# Patient Record
Sex: Female | Born: 1935 | Race: White | Hispanic: No | Smoking: Never smoker
Health system: Southern US, Community
[De-identification: ages and names within clinical notes are randomized; demographics above are authoritative.]

## PROBLEM LIST (undated history)

## (undated) DIAGNOSIS — E46 Unspecified protein-calorie malnutrition: Secondary | ICD-10-CM

## (undated) DIAGNOSIS — I10 Essential (primary) hypertension: Secondary | ICD-10-CM

## (undated) DIAGNOSIS — M1990 Unspecified osteoarthritis, unspecified site: Secondary | ICD-10-CM

## (undated) DIAGNOSIS — K2950 Unspecified chronic gastritis without bleeding: Secondary | ICD-10-CM

## (undated) DIAGNOSIS — D649 Anemia, unspecified: Secondary | ICD-10-CM

## (undated) DIAGNOSIS — F419 Anxiety disorder, unspecified: Secondary | ICD-10-CM

## (undated) DIAGNOSIS — N189 Chronic kidney disease, unspecified: Secondary | ICD-10-CM

## (undated) DIAGNOSIS — G2581 Restless legs syndrome: Secondary | ICD-10-CM

## (undated) HISTORY — DX: Anemia, unspecified: D64.9

## (undated) HISTORY — DX: Anxiety disorder, unspecified: F41.9

## (undated) HISTORY — PX: ABDOMINAL HYSTERECTOMY: SHX81

## (undated) HISTORY — DX: Unspecified osteoarthritis, unspecified site: M19.90

## (undated) HISTORY — DX: Unspecified protein-calorie malnutrition: E46

## (undated) HISTORY — DX: Chronic kidney disease, unspecified: N18.9

## (undated) HISTORY — DX: Unspecified chronic gastritis without bleeding: K29.50

## (undated) HISTORY — DX: Restless legs syndrome: G25.81

## (undated) HISTORY — DX: Essential (primary) hypertension: I10

---

## 2004-09-27 ENCOUNTER — Emergency Department: Payer: Self-pay | Admitting: Emergency Medicine

## 2004-09-27 ENCOUNTER — Other Ambulatory Visit: Payer: Self-pay

## 2004-11-19 ENCOUNTER — Ambulatory Visit: Payer: Self-pay | Admitting: Urology

## 2006-10-19 ENCOUNTER — Emergency Department: Payer: Self-pay | Admitting: Emergency Medicine

## 2007-01-14 ENCOUNTER — Other Ambulatory Visit: Payer: Self-pay

## 2007-01-14 ENCOUNTER — Emergency Department: Payer: Self-pay | Admitting: Emergency Medicine

## 2007-12-06 ENCOUNTER — Ambulatory Visit: Payer: Self-pay | Admitting: Internal Medicine

## 2007-12-13 ENCOUNTER — Ambulatory Visit: Payer: Self-pay | Admitting: Internal Medicine

## 2008-06-11 ENCOUNTER — Inpatient Hospital Stay: Payer: Self-pay | Admitting: Internal Medicine

## 2008-08-09 ENCOUNTER — Ambulatory Visit: Payer: Self-pay | Admitting: Internal Medicine

## 2008-12-15 ENCOUNTER — Ambulatory Visit: Payer: Self-pay | Admitting: Internal Medicine

## 2009-01-04 ENCOUNTER — Ambulatory Visit: Payer: Self-pay | Admitting: Internal Medicine

## 2009-12-10 ENCOUNTER — Emergency Department: Payer: Self-pay | Admitting: Internal Medicine

## 2010-01-02 ENCOUNTER — Ambulatory Visit: Payer: Self-pay

## 2010-01-21 ENCOUNTER — Inpatient Hospital Stay: Payer: Self-pay | Admitting: Internal Medicine

## 2010-02-24 ENCOUNTER — Inpatient Hospital Stay: Payer: Self-pay | Admitting: Internal Medicine

## 2010-03-05 LAB — PATHOLOGY REPORT

## 2010-10-21 ENCOUNTER — Ambulatory Visit: Payer: Self-pay | Admitting: Gastroenterology

## 2010-10-23 LAB — PATHOLOGY REPORT

## 2010-11-17 ENCOUNTER — Emergency Department: Payer: Self-pay | Admitting: Emergency Medicine

## 2011-04-22 ENCOUNTER — Other Ambulatory Visit: Payer: Self-pay | Admitting: *Deleted

## 2011-04-22 MED ORDER — SERTRALINE HCL 50 MG PO TABS: 50.0000 mg | ORAL_TABLET | Freq: Every day | ORAL | Status: DC

## 2011-04-22 NOTE — Telephone Encounter (Signed)
Received faxed refill request from pharmacy. Is okay to refill Sertraline?

## 2011-05-12 ENCOUNTER — Ambulatory Visit: Payer: Self-pay | Admitting: Oncology

## 2011-05-16 ENCOUNTER — Other Ambulatory Visit: Payer: Self-pay | Admitting: Internal Medicine

## 2011-05-16 MED ORDER — CLORAZEPATE DIPOTASSIUM 7.5 MG PO TABS: 7.5000 mg | ORAL_TABLET | Freq: Every day | ORAL | Status: AC | PRN

## 2011-05-16 MED ORDER — MELOXICAM 15 MG PO TABS: 15.0000 mg | ORAL_TABLET | Freq: Every day | ORAL | Status: DC

## 2011-06-06 ENCOUNTER — Inpatient Hospital Stay: Payer: Self-pay | Admitting: Internal Medicine

## 2011-06-09 DIAGNOSIS — R740 Nonspecific elevation of levels of transaminase and lactic acid dehydrogenase [LDH]: Secondary | ICD-10-CM

## 2011-06-09 DIAGNOSIS — M6282 Rhabdomyolysis: Secondary | ICD-10-CM

## 2011-06-09 DIAGNOSIS — N179 Acute kidney failure, unspecified: Secondary | ICD-10-CM

## 2011-06-09 DIAGNOSIS — D638 Anemia in other chronic diseases classified elsewhere: Secondary | ICD-10-CM

## 2011-06-11 DIAGNOSIS — N179 Acute kidney failure, unspecified: Secondary | ICD-10-CM

## 2011-06-11 DIAGNOSIS — D631 Anemia in chronic kidney disease: Secondary | ICD-10-CM

## 2011-06-11 DIAGNOSIS — N039 Chronic nephritic syndrome with unspecified morphologic changes: Secondary | ICD-10-CM

## 2011-06-11 DIAGNOSIS — J9600 Acute respiratory failure, unspecified whether with hypoxia or hypercapnia: Secondary | ICD-10-CM

## 2011-06-11 DIAGNOSIS — M6282 Rhabdomyolysis: Secondary | ICD-10-CM

## 2011-06-12 ENCOUNTER — Ambulatory Visit: Payer: Self-pay | Admitting: Oncology

## 2011-06-12 DIAGNOSIS — I517 Cardiomegaly: Secondary | ICD-10-CM

## 2011-06-16 ENCOUNTER — Telehealth: Payer: Self-pay | Admitting: Internal Medicine

## 2011-06-16 NOTE — Telephone Encounter (Signed)
Autumn Wright is being discharge today the hospital is needing you to do an addendum for her discharge even if there are no changes.

## 2011-06-17 ENCOUNTER — Encounter: Payer: Self-pay | Admitting: Internal Medicine

## 2011-06-23 ENCOUNTER — Telehealth: Payer: Self-pay | Admitting: Internal Medicine

## 2011-06-23 NOTE — Telephone Encounter (Signed)
Son called in and states that she was recently seen in the hospital she states she was suppose to get a pain medication called in mscontin 15 mg taken every 12 hours, percocet 10-325 take a tablet every 6 hours as needed.and he states that his mother does have a prescription for hydrocodone this was prescribed by Dr. Angela Cox, it is for pain as well.  His question was does she need to take the two prescriptions she was giving in the hospital instead of the hydrocodone. Please advise.

## 2011-06-24 NOTE — Telephone Encounter (Signed)
The pain medications given to her in the hospital can be continued if her pain is still severe enough to require them.  If the she is using vicodin now that she is home and it is controlling her pain , then she should use the vicodin instead.  Has she made a hospital followup appt?  Both of the other prescriptiosn mentioned cannot be called in.

## 2011-06-24 NOTE — Telephone Encounter (Signed)
Spoke w/Son, she has been taking hydrocodone 7.5/325 every 4 to 6 hours as needed which has been controlling her pain, Med list updated. Son says she has plenty in her bottle, no RF needed at this time. Transferred son to schedule hosptial f/u.

## 2011-06-30 ENCOUNTER — Telehealth: Payer: Self-pay | Admitting: Internal Medicine

## 2011-06-30 DIAGNOSIS — M549 Dorsalgia, unspecified: Secondary | ICD-10-CM

## 2011-06-30 MED ORDER — OXYCODONE-ACETAMINOPHEN 5-325 MG PO TABS: 1.0000 | ORAL_TABLET | Freq: Four times a day (QID) | ORAL | Status: DC | PRN

## 2011-06-30 NOTE — Telephone Encounter (Signed)
Larita Fife from [facility] called and stated patient reported to her today that the hydrocodone that was prescribed for pain is making her have nightmares at night so she has stopped taking the medication, and tylenol does not help.  She wanted to know if there is something else she can take for pain.  Please advise.

## 2011-06-30 NOTE — Telephone Encounter (Signed)
Notified Larita Fife of the Rx, she stated either the patient or patients son will pick up Rx.

## 2011-06-30 NOTE — Telephone Encounter (Signed)
She was takign percocet previously which is stronger .  I have printed an rx for percocet that she may use max #/day.

## 2011-07-01 ENCOUNTER — Encounter: Payer: Self-pay | Admitting: Internal Medicine

## 2011-07-07 ENCOUNTER — Telehealth: Payer: Self-pay | Admitting: Internal Medicine

## 2011-07-07 MED ORDER — ZOLPIDEM TARTRATE 10 MG PO TABS: 10.0000 mg | ORAL_TABLET | Freq: Every evening | ORAL | Status: DC | PRN

## 2011-07-07 NOTE — Telephone Encounter (Signed)
Yes she should keep the appt with  Dr Thedore Mins, and yes we can resume zolpidem.  Does she need an rx?   Ok to call in if no,  10 mg   #30 2 refills

## 2011-07-07 NOTE — Telephone Encounter (Signed)
Patient called and wanted to know if she needed to keep the appt with Dr. Thedore Mins on Nov 30th because she has an appt with you on Dec 4th and Dr. Nicholaus Corolla on the 6th.  Patient also wanted to know if she could go back on zolpidem because she is having trouble sleeping.  Please advise.

## 2011-07-07 NOTE — Telephone Encounter (Signed)
Patient does want her prescription for zolpidem 10 mg #30 with 2 refills.

## 2011-07-07 NOTE — Telephone Encounter (Signed)
Rx has been called in  

## 2011-07-08 ENCOUNTER — Other Ambulatory Visit: Payer: Self-pay | Admitting: Internal Medicine

## 2011-07-08 MED ORDER — LISINOPRIL 20 MG PO TABS: 20.0000 mg | ORAL_TABLET | Freq: Every day | ORAL | Status: DC

## 2011-07-12 ENCOUNTER — Ambulatory Visit: Payer: Self-pay | Admitting: Oncology

## 2011-07-12 ENCOUNTER — Encounter: Payer: Self-pay | Admitting: Internal Medicine

## 2011-07-15 ENCOUNTER — Encounter: Payer: Self-pay | Admitting: Internal Medicine

## 2011-07-15 ENCOUNTER — Ambulatory Visit (INDEPENDENT_AMBULATORY_CARE_PROVIDER_SITE_OTHER): Payer: Medicare Other | Admitting: Internal Medicine

## 2011-07-15 DIAGNOSIS — R269 Unspecified abnormalities of gait and mobility: Secondary | ICD-10-CM

## 2011-07-15 DIAGNOSIS — K5289 Other specified noninfective gastroenteritis and colitis: Secondary | ICD-10-CM

## 2011-07-15 DIAGNOSIS — E119 Type 2 diabetes mellitus without complications: Secondary | ICD-10-CM

## 2011-07-15 DIAGNOSIS — K2940 Chronic atrophic gastritis without bleeding: Secondary | ICD-10-CM

## 2011-07-15 DIAGNOSIS — I10 Essential (primary) hypertension: Secondary | ICD-10-CM

## 2011-07-15 DIAGNOSIS — N189 Chronic kidney disease, unspecified: Secondary | ICD-10-CM

## 2011-07-15 DIAGNOSIS — J811 Chronic pulmonary edema: Secondary | ICD-10-CM

## 2011-07-15 DIAGNOSIS — G2581 Restless legs syndrome: Secondary | ICD-10-CM

## 2011-07-15 DIAGNOSIS — E46 Unspecified protein-calorie malnutrition: Secondary | ICD-10-CM | POA: Insufficient documentation

## 2011-07-15 DIAGNOSIS — K2950 Unspecified chronic gastritis without bleeding: Secondary | ICD-10-CM

## 2011-07-15 DIAGNOSIS — F329 Major depressive disorder, single episode, unspecified: Secondary | ICD-10-CM

## 2011-07-15 DIAGNOSIS — D500 Iron deficiency anemia secondary to blood loss (chronic): Secondary | ICD-10-CM

## 2011-07-15 NOTE — Patient Instructions (Signed)
You do not appear to need supplemental oxygen anymore.    Please turn off the oxygen and check your pulse ox without it after 1  Hours.  As long as your oxygen is above 88% ,  Yo do not need to resume the oxygen and we can return it to the company  Pleas ask Dr. Orlie Dakin and Dr. Thedore Mins to forward any labs they draw to me.

## 2011-07-15 NOTE — Progress Notes (Signed)
  Subjective:    Patient ID: Autumn Wright, female    DOB: 27-Sep-1934, 76 y.o.   MRN: [id number]  HPI Autumn Wright is a 76 yo [place] female who present for hospital followu.  seh was admitted on Oct with 26 with Acute renal failure secondary ro rhabdomyolysis , Cr was 7.0 ,  accompanied by 2 day history of N/V resulting in dehydration.  9 Day stay, received IV bicarbonate with eventual resolution of rhabdo and ARF.  Because of concurrent malnutrition and low albumin stores she had considerable weight gain and pulmonary edema with significant hypoxia.  Also received 1 unit PRBCs transfusion for hgb < 8 and hypoxia. Was dc'd home with 02, home health and home PT.  She is continuing to wear her  02  But Pt has recently measured her ambulatory sats on room air with results > 92%.  Appetite is better, she has gained weight.  Sleep disrupted by dysethesias in feet and she wants to resume Requip.     Review of Systems  Constitutional: Negative for fever, chills and unexpected weight change.  HENT: Negative for hearing loss, ear pain, nosebleeds, congestion, sore throat, facial swelling, rhinorrhea, sneezing, mouth sores, trouble swallowing, neck pain, neck stiffness, voice change, postnasal drip, sinus pressure, tinnitus and ear discharge.   Eyes: Negative for pain, discharge, redness and visual disturbance.  Respiratory: Negative for cough, chest tightness, shortness of breath, wheezing and stridor.   Cardiovascular: Negative for chest pain, palpitations and leg swelling.  Musculoskeletal: Negative for myalgias and arthralgias.  Skin: Negative for color change and rash.  Neurological: Negative for dizziness, weakness, light-headedness and headaches.  Hematological: Negative for adenopathy.       Objective:   Physical Exam  Constitutional: She is oriented to person, place, and time. She appears well-developed and well-nourished.  HENT:  Mouth/Throat: Oropharynx is clear and moist.  Eyes: EOM are  normal. Pupils are equal, round, and reactive to light. No scleral icterus.  Neck: Normal range of motion. Neck supple. No JVD present. No thyromegaly present.  Cardiovascular: Normal rate, regular rhythm, normal heart sounds and intact distal pulses.   Pulmonary/Chest: Effort normal and breath sounds normal.  Abdominal: Soft. Bowel sounds are normal. She exhibits no mass. There is no tenderness.  Musculoskeletal: Normal range of motion. She exhibits no edema.  Lymphadenopathy:    She has no cervical adenopathy.  Neurological: She is alert and oriented to person, place, and time.  Skin: Skin is warm and dry.  Psychiatric: She has a normal mood and affect.          Assessment & Plan:

## 2011-07-16 ENCOUNTER — Encounter: Payer: Self-pay | Admitting: Internal Medicine

## 2011-07-16 DIAGNOSIS — K2950 Unspecified chronic gastritis without bleeding: Secondary | ICD-10-CM | POA: Insufficient documentation

## 2011-07-16 DIAGNOSIS — D649 Anemia, unspecified: Secondary | ICD-10-CM | POA: Insufficient documentation

## 2011-07-16 DIAGNOSIS — G2581 Restless legs syndrome: Secondary | ICD-10-CM | POA: Insufficient documentation

## 2011-07-16 DIAGNOSIS — N189 Chronic kidney disease, unspecified: Secondary | ICD-10-CM | POA: Insufficient documentation

## 2011-07-16 NOTE — Assessment & Plan Note (Signed)
With recurrent admission for N/V, last on Oct 26th.  Currently asymptomatic and  appetite improving.

## 2011-07-16 NOTE — Assessment & Plan Note (Signed)
With acute failure secondary to rhabdomyolysis.  Has followup with Nephrology next week.

## 2011-07-16 NOTE — Assessment & Plan Note (Signed)
Secondary to chronic gastritis. Improving  weight,  GB was evaluated for chronic cholcystitsi with HIDA sca n , which was normal/

## 2011-07-16 NOTE — Assessment & Plan Note (Signed)
Resuming Requip for persistent symptoms. Interfering with sleep

## 2011-07-16 NOTE — Assessment & Plan Note (Signed)
Resolved by exam.  02 sats today on 2L are exceleltn and 02 sats on room air ambulation of 350 ft were 94% .  Will have her dc it at home,  Recheck with home pulse ox and if > 88% will dc supplemental 02

## 2011-07-16 NOTE — Assessment & Plan Note (Signed)
With negative serologic workup in house,  Requiring transfusion due to concurrent hypoxia,  Followup with Dr. Orlie Dakin for Procrit injections next week.

## 2011-07-17 ENCOUNTER — Ambulatory Visit: Payer: Self-pay | Admitting: Oncology

## 2011-07-21 ENCOUNTER — Telehealth: Payer: Self-pay | Admitting: Internal Medicine

## 2011-07-21 NOTE — Telephone Encounter (Signed)
I cannot prescribe something else without seeing her.  There are too many potential side effects and I need more information

## 2011-07-21 NOTE — Telephone Encounter (Signed)
Patient having problems sleeping . Her Zolpidem is not working.

## 2011-07-21 NOTE — Telephone Encounter (Signed)
Patient is asking if she can increase her zolpidem or either try something different because it is not helping her sleep.

## 2011-07-22 NOTE — Telephone Encounter (Signed)
Patient returned call and was notified. Appt scheduled.

## 2011-07-22 NOTE — Telephone Encounter (Signed)
Left message asking patient to return my call.

## 2011-07-25 ENCOUNTER — Ambulatory Visit (INDEPENDENT_AMBULATORY_CARE_PROVIDER_SITE_OTHER): Payer: Medicare Other | Admitting: Internal Medicine

## 2011-07-25 ENCOUNTER — Encounter: Payer: Self-pay | Admitting: Internal Medicine

## 2011-07-25 VITALS — BP 122/68 | HR 59 | Temp 98.5°F | Ht 62.0 in | Wt 168.0 lb

## 2011-07-25 DIAGNOSIS — J811 Chronic pulmonary edema: Secondary | ICD-10-CM

## 2011-07-25 DIAGNOSIS — K2970 Gastritis, unspecified, without bleeding: Secondary | ICD-10-CM

## 2011-07-25 DIAGNOSIS — F5104 Psychophysiologic insomnia: Secondary | ICD-10-CM

## 2011-07-25 DIAGNOSIS — G2581 Restless legs syndrome: Secondary | ICD-10-CM

## 2011-07-25 DIAGNOSIS — N189 Chronic kidney disease, unspecified: Secondary | ICD-10-CM

## 2011-07-25 DIAGNOSIS — D500 Iron deficiency anemia secondary to blood loss (chronic): Secondary | ICD-10-CM

## 2011-07-25 DIAGNOSIS — G4700 Insomnia, unspecified: Secondary | ICD-10-CM

## 2011-07-25 MED ORDER — OMEPRAZOLE 40 MG PO CPDR: 40.0000 mg | DELAYED_RELEASE_CAPSULE | Freq: Two times a day (BID) | ORAL | Status: DC

## 2011-07-25 MED ORDER — TEMAZEPAM 15 MG PO CAPS: 15.0000 mg | ORAL_CAPSULE | Freq: Every evening | ORAL | Status: DC | PRN

## 2011-07-25 MED ORDER — DOXEPIN HCL 3 MG PO TABS: 1.0000 | ORAL_TABLET | Freq: Every day | ORAL | Status: DC

## 2011-07-25 MED ORDER — SUCRALFATE 1 G PO TABS: 1.0000 g | ORAL_TABLET | Freq: Four times a day (QID) | ORAL | Status: AC

## 2011-07-25 NOTE — Patient Instructions (Addendum)
For your blood pressure.  stop  the hydralazine ( 4 times daily drug)  If your blood pressure begins to stay above 130/80. Increase your metoprolol tartrate to twice daily   For your insomnia:  I have given you 4 days of Silenor to use   i have sent an rx for temazepam to your pharmacy,  O e capsule 1 hour before bedtime.

## 2011-07-26 ENCOUNTER — Encounter: Payer: Self-pay | Admitting: Internal Medicine

## 2011-07-26 DIAGNOSIS — F5104 Psychophysiologic insomnia: Secondary | ICD-10-CM | POA: Insufficient documentation

## 2011-07-26 NOTE — Progress Notes (Signed)
Subjective:    Patient ID: Autumn Wright, female    DOB: 1934-10-12, 76 y.o.   MRN: [id number]  HPI  Autumn Wright is a delightful 76 yr old [place] immigrant who has a history of chronic insomnia, severe anemia, gastritis with episodes of recurrent nausea and vomiting, and chronic back pain due to DDD who presents to dsicuss alternative therapy for her insomnia.  Since discharge from hospital several weeks ago for acute renal failure, anemia, and rhabdomyolysis complicated by hypoxia and severe malnutrtition she has had worsening insomnia despite using zolpidem 10 mg  which she has used for years.  She has early wakening at 2 or 3 am and cannot return to sleep.  Her pain is well controlled.  She does not drink alcohol. She denies nocturia greater than 1 void per night.   She does watch TV in bed, which helps her fall asleep. She hascome to terms with her grief and guilt following her husband Autumn Wright's recent death, but is having frequent nightmares him Autumn Wright was placed in nursing home prior to his death due to worsening dementia with frequent falls and behavioral disturbances).    Past Medical History  Diagnosis Date  . Restless leg syndrome   . Arthritis   . Osteoporosis   . Migraine since age 89  . Hypertension   . Anxiety   . Gastritis, chronic   . Malnutrition   . Anemia   . Chronic kidney disease    Current Outpatient Prescriptions on File Prior to Visit  Medication Sig Dispense Refill  . hydrALAZINE (APRESOLINE) 50 MG tablet Take 50 mg by mouth 4 (four) times daily.        Marland Kitchen loratadine (CLARITIN) 10 MG tablet Take 10 mg by mouth daily.        . metoprolol (LOPRESSOR) 50 MG tablet Take 50 mg by mouth 2 (two) times daily.        Marland Kitchen morphine (MS CONTIN) 15 MG 12 hr tablet Take 15 mg by mouth 2 (two) times daily.        Marland Kitchen nystatin (MYCOSTATIN) powder Apply 100,000 g topically 2 (two) times daily.        Marland Kitchen oxyCODONE-acetaminophen (ROXICET) 5-325 MG per tablet Take 1 tablet by mouth every 6  (six) hours as needed for pain.  90 tablet  0  . polyethylene glycol (MIRALAX / GLYCOLAX) packet Take 17 g by mouth daily.          Review of Systems  Constitutional: Negative for fever, chills and unexpected weight change.  HENT: Negative for hearing loss, ear pain, nosebleeds, congestion, sore throat, facial swelling, rhinorrhea, sneezing, mouth sores, trouble swallowing, neck pain, neck stiffness, voice change, postnasal drip, sinus pressure, tinnitus and ear discharge.   Eyes: Negative for pain, discharge, redness and visual disturbance.  Respiratory: Negative for cough, chest tightness, shortness of breath, wheezing and stridor.   Cardiovascular: Negative for chest pain, palpitations and leg swelling.  Musculoskeletal: Positive for back pain. Negative for myalgias and arthralgias.  Skin: Negative for color change and rash.  Neurological: Negative for dizziness, weakness, light-headedness and headaches.  Hematological: Negative for adenopathy.  Psychiatric/Behavioral: Positive for sleep disturbance. The patient is nervous/anxious.        Objective:   Physical Exam  Constitutional: She is oriented to person, place, and time. She appears well-developed and well-nourished.  HENT:  Mouth/Throat: Oropharynx is clear and moist.  Eyes: EOM are normal. Pupils are equal, round, and reactive to light. No scleral icterus.  Neck: Normal range of motion. Neck supple. No JVD present. No thyromegaly present.  Cardiovascular: Normal rate, regular rhythm, normal heart sounds and intact distal pulses.   Pulmonary/Chest: Effort normal and breath sounds normal.  Musculoskeletal: Normal range of motion. She exhibits no edema.  Lymphadenopathy:    She has no cervical adenopathy.  Neurological: She is alert and oriented to person, place, and time.  Skin: Skin is warm and dry.  Psychiatric: She has a normal mood and affect. Her behavior is normal. Judgment and thought content normal.            Assessment & Plan:   Restless legs syndrome We have resumed Requip at last visit for RLS, and she currently does not report these symptoms as contributors to her current problems with insomnia.   Pulmonary edema Now resolved completely with daily duiretic therapy.  Room air saturations are normal.   Insomnia, psychophysiological Although she suffers from chronic insomnia, her current issues may be secondary to  unresolved guilt related to placing her husband in a nursing home due to her difficulty caring for him at home.  Discussed trial of temazepam .   Chronic kidney disease With recent acute renal failure secondary to rhabdomylosis requiring hospitalization and bicarbonate infusion which resulted in severe pulmonary edema and hypoxia.  Her Cr peaked at 7.4 and continues to improve, now around 2.0.  She has had recent follow up with Dr. Thedore Mins.   Anemia due to blood loss, chronic Multifactorial , with MDS suspected, now managed by  Dr. Orlie Dakin with Procrit.  No plans or BM biopsy unless her anemia worsens despite Procrit injections    Updated Medication List Outpatient Encounter Prescriptions as of 07/25/2011  Medication Sig Dispense Refill  . hydrALAZINE (APRESOLINE) 50 MG tablet Take 50 mg by mouth 4 (four) times daily.        Marland Kitchen loratadine (CLARITIN) 10 MG tablet Take 10 mg by mouth daily.        . metoprolol (LOPRESSOR) 50 MG tablet Take 50 mg by mouth 2 (two) times daily.        Marland Kitchen morphine (MS CONTIN) 15 MG 12 hr tablet Take 15 mg by mouth 2 (two) times daily.        Marland Kitchen nystatin (MYCOSTATIN) powder Apply 100,000 g topically 2 (two) times daily.        Marland Kitchen omeprazole (PRILOSEC) 40 MG capsule Take 1 capsule (40 mg total) by mouth 2 (two) times daily.  60 capsule  11  . oxyCODONE-acetaminophen (ROXICET) 5-325 MG per tablet Take 1 tablet by mouth every 6 (six) hours as needed for pain.  90 tablet  0  . polyethylene glycol (MIRALAX / GLYCOLAX) packet Take 17 g by mouth daily.        Marland Kitchen  DISCONTD: pantoprazole (PROTONIX) 40 MG tablet Take 40 mg by mouth 2 (two) times daily.       Marland Kitchen DISCONTD: sucralfate (CARAFATE) 1 GM/10ML suspension Take 1 g by mouth 4 (four) times daily.       . Doxepin HCl (SILENOR) 3 MG TABS Take 1 tablet (3 mg total) by mouth at bedtime.  4 tablet  0  . sucralfate (CARAFATE) 1 G tablet Take 1 tablet (1 g total) by mouth 4 (four) times daily.  120 tablet  1  . temazepam (RESTORIL) 15 MG capsule Take 1 capsule (15 mg total) by mouth at bedtime as needed for sleep.  30 capsule  0  . DISCONTD: temazepam (RESTORIL) 15 MG capsule  Take 1 capsule (15 mg total) by mouth at bedtime as needed for sleep.  30 capsule  0  . DISCONTD: zolpidem (AMBIEN) 10 MG tablet Take 1 tablet (10 mg total) by mouth at bedtime as needed for sleep.  30 tablet  2

## 2011-07-26 NOTE — Assessment & Plan Note (Signed)
Although she suffers from chronic insomnia, her current issues may be secondary to  unresolved guilt related to placing her husband in a nursing home due to her difficulty caring for him at home.  Discussed trial of temazepam .

## 2011-07-26 NOTE — Assessment & Plan Note (Signed)
We have resumed Requip at last visit for RLS, and she currently does not report these symptoms as contributors to her current problems with insomnia.

## 2011-07-26 NOTE — Assessment & Plan Note (Signed)
Now resolved completely with daily duiretic therapy.  Room air saturations are normal.

## 2011-07-26 NOTE — Assessment & Plan Note (Addendum)
With recent acute renal failure secondary to rhabdomylosis requiring hospitalization and bicarbonate infusion which resulted in severe pulmonary edema and hypoxia.  Her Cr peaked at 7.4 and continues to improve, now around 2.0.  She has had recent follow up with Dr. Thedore Mins.

## 2011-07-26 NOTE — Assessment & Plan Note (Signed)
Multifactorial , with MDS suspected, now managed by  Dr. Orlie Dakin with Procrit.  No plans or BM biopsy unless her anemia worsens despite Procrit injections

## 2011-07-31 ENCOUNTER — Telehealth: Payer: Self-pay | Admitting: *Deleted

## 2011-07-31 NOTE — Telephone Encounter (Signed)
error 

## 2011-08-04 ENCOUNTER — Telehealth: Payer: Self-pay | Admitting: Internal Medicine

## 2011-08-04 MED ORDER — METOPROLOL TARTRATE 50 MG PO TABS: 50.0000 mg | ORAL_TABLET | Freq: Two times a day (BID) | ORAL | Status: DC

## 2011-08-04 NOTE — Telephone Encounter (Signed)
Done. Patient informed

## 2011-08-04 NOTE — Telephone Encounter (Signed)
[phone] Pt called only has 1 bp med left Metoprolol tarta 50mg    She takes 1 in am and 1 in pm [facility] Pt let pt know when this is called in

## 2011-08-12 ENCOUNTER — Ambulatory Visit: Payer: Self-pay | Admitting: Oncology

## 2011-08-12 ENCOUNTER — Encounter: Payer: Self-pay | Admitting: Internal Medicine

## 2011-08-13 ENCOUNTER — Telehealth: Payer: Self-pay | Admitting: Internal Medicine

## 2011-08-13 NOTE — Telephone Encounter (Signed)
With messages like these it is important to refer to the actual medication that she is taking.  Please clarify that she is referring  to temazepam 15 mg that is not working.  Her request or inquiry about The extended release ambien:  It  is not generic and may cost her a lot of money,.  Has she tried increasing the temazepam to 30 mg?

## 2011-08-13 NOTE — Telephone Encounter (Signed)
Spoke with pt and advised her about [place].  She is going to try increasing the temazepam dose to 30 mg's and will call back in a few days with report.

## 2011-08-13 NOTE — Telephone Encounter (Signed)
[phone] Pt called and the rx you gave her to help her sleep is not working very well.  She is still only getting about 4 hours a sleep a night. Is there anything she can take pt asking about a time release ambiem [facility]

## 2011-08-15 ENCOUNTER — Other Ambulatory Visit: Payer: Self-pay | Admitting: *Deleted

## 2011-08-15 DIAGNOSIS — M549 Dorsalgia, unspecified: Secondary | ICD-10-CM

## 2011-08-15 MED ORDER — OXYCODONE-ACETAMINOPHEN 5-325 MG PO TABS: 1.0000 | ORAL_TABLET | Freq: Four times a day (QID) | ORAL | Status: DC | PRN

## 2011-08-15 NOTE — Telephone Encounter (Signed)
Pt is asking for a refill, please call when ready.

## 2011-08-15 NOTE — Telephone Encounter (Signed)
On printer

## 2011-08-15 NOTE — Telephone Encounter (Signed)
Rx printed and signed by Dr. Darrick Huntsman. Patient notified. Rx left up front for pick up.

## 2011-08-15 NOTE — Telephone Encounter (Signed)
Pt was told to take 30 mg's of temazepam to help her sleep, but she realized that she had been taking the 30 mg's instead of 15.  She says the 30 mg's isn't helping and she would like to try ambien cr.  [facility]ses [facility].

## 2011-08-18 MED ORDER — ZOLPIDEM TARTRATE ER 12.5 MG PO TBCR: 12.5000 mg | EXTENDED_RELEASE_TABLET | Freq: Every evening | ORAL | Status: DC | PRN

## 2011-08-18 NOTE — Telephone Encounter (Signed)
Thank you.  ambien cr rx on printer , can call in

## 2011-08-18 NOTE — Telephone Encounter (Signed)
Ambien called to pharmacy, advised pt's son.

## 2011-08-25 ENCOUNTER — Telehealth: Payer: Self-pay | Admitting: Internal Medicine

## 2011-08-25 NOTE — Telephone Encounter (Signed)
Patient thinks she is having a reaction to her Metoprolol 50 mg . She is itching,sweating and has a sore throat.

## 2011-08-25 NOTE — Telephone Encounter (Signed)
Not somehting i can decide over the phone,.  Make appt

## 2011-08-25 NOTE — Telephone Encounter (Signed)
Pt states that about 4 days ago she developed a very sore throat and stuffy nose. Then she started having sweating spells and her face started itching.  She did some research and found that these symptoms could be side effects of metoprolol.  She didn't take that last night or this morning and says the itching in her face is better, but her face is burning- like a wind burn.  She is asking if she should continue to take this or change to something else.  [facility].

## 2011-08-25 NOTE — Telephone Encounter (Signed)
Appt made

## 2011-08-28 ENCOUNTER — Encounter: Payer: Self-pay | Admitting: Internal Medicine

## 2011-08-28 ENCOUNTER — Ambulatory Visit (INDEPENDENT_AMBULATORY_CARE_PROVIDER_SITE_OTHER): Payer: Medicare Other | Admitting: Internal Medicine

## 2011-08-28 DIAGNOSIS — I10 Essential (primary) hypertension: Secondary | ICD-10-CM

## 2011-08-28 DIAGNOSIS — J019 Acute sinusitis, unspecified: Secondary | ICD-10-CM

## 2011-08-28 MED ORDER — AMLODIPINE BESYLATE 10 MG PO TABS: 10.0000 mg | ORAL_TABLET | Freq: Every day | ORAL | Status: DC

## 2011-08-28 MED ORDER — DIPHENOXYLATE-ATROPINE 2.5-0.025 MG PO TABS: 1.0000 | ORAL_TABLET | Freq: Four times a day (QID) | ORAL | Status: AC | PRN

## 2011-08-28 MED ORDER — PREDNISONE (PAK) 10 MG PO TABS: ORAL_TABLET | ORAL | Status: AC

## 2011-08-28 MED ORDER — LEVOFLOXACIN 500 MG PO TABS: 500.0000 mg | ORAL_TABLET | Freq: Every day | ORAL | Status: AC

## 2011-08-28 NOTE — Patient Instructions (Signed)
Reduce the metoprolol to 1/2 tablet twice daily for 2 days , then stop  Start the amlodipine today one tablet daily   I am prescribing levaquin, prednisone fore the sinus infection .

## 2011-08-29 ENCOUNTER — Emergency Department: Payer: Self-pay | Admitting: Emergency Medicine

## 2011-08-30 LAB — COMPREHENSIVE METABOLIC PANEL
Albumin: 3.7 g/dL (ref 3.4–5.0)
Alkaline Phosphatase: 83 U/L (ref 50–136)
BUN: 31 mg/dL — ABNORMAL HIGH (ref 7–18)
Bilirubin,Total: 0.4 mg/dL (ref 0.2–1.0)
Calcium, Total: 9.3 mg/dL (ref 8.5–10.1)
EGFR (African American): 47 — ABNORMAL LOW
Glucose: 155 mg/dL — ABNORMAL HIGH (ref 65–99)
Potassium: 4.2 mmol/L (ref 3.5–5.1)
SGPT (ALT): 28 U/L
Total Protein: 8 g/dL (ref 6.4–8.2)

## 2011-08-30 LAB — CBC
HGB: 11.7 g/dL — ABNORMAL LOW (ref 12.0–16.0)
MCHC: 33.7 g/dL (ref 32.0–36.0)
Platelet: 243 10*3/uL (ref 150–440)
RBC: 3.33 10*6/uL — ABNORMAL LOW (ref 3.80–5.20)

## 2011-08-30 NOTE — Progress Notes (Signed)
Subjective:    Patient ID: Autumn Wright, female    DOB: [birth year], 77 y.o.   MRN: [id number]  HPI  Autumn Wright is a 77 yr old white female with a recent history of hypertension, chronic back pain secondary to DDD, chronic gastritis with recurrent nausea and vomiting, and recent hospitalization for acute renal failurewit anemia  secondary to severe rhabdomyolysis, who presents for possible drug reaction.  She recently developed symptoms of an upper respiratory infection with sinus pian , congestion and purulent drainage accompanied by facial redness and a feeling of slapped cheeks.  She also developed itching and has attributed he symptoms to a reaction to metoprolol after reading something online.  Her itching has improved but she continues to have sinus symptoms.  Past Medical History  Diagnosis Date  . Restless leg syndrome   . Arthritis   . Osteoporosis   . Migraine since age 22  . Hypertension   . Anxiety   . Gastritis, chronic   . Malnutrition   . Anemia   . Chronic kidney disease    Current Outpatient Prescriptions on File Prior to Visit  Medication Sig Dispense Refill  . Doxepin HCl (SILENOR) 3 MG TABS Take 1 tablet (3 mg total) by mouth at bedtime.  4 tablet  0  . loratadine (CLARITIN) 10 MG tablet Take 10 mg by mouth daily.        . metoprolol (LOPRESSOR) 50 MG tablet Take 1 tablet (50 mg total) by mouth 2 (two) times daily.  180 tablet  1  . morphine (MS CONTIN) 15 MG 12 hr tablet Take 15 mg by mouth 2 (two) times daily.        Marland Kitchen nystatin (MYCOSTATIN) powder Apply 100,000 g topically 2 (two) times daily.        Marland Kitchen omeprazole (PRILOSEC) 40 MG capsule Take 1 capsule (40 mg total) by mouth 2 (two) times daily.  60 capsule  11  . oxyCODONE-acetaminophen (ROXICET) 5-325 MG per tablet Take 1 tablet by mouth every 6 (six) hours as needed for pain.  90 tablet  0  . polyethylene glycol (MIRALAX / GLYCOLAX) packet Take 17 g by mouth daily.        . sucralfate (CARAFATE) 1 G tablet Take  1 tablet (1 g total) by mouth 4 (four) times daily.  120 tablet  1  . zolpidem (AMBIEN CR) 12.5 MG CR tablet Take 1 tablet (12.5 mg total) by mouth at bedtime as needed for sleep.  30 tablet  3  . hydrALAZINE (APRESOLINE) 50 MG tablet Take 50 mg by mouth 4 (four) times daily.            Review of Systems  Constitutional: Negative for fever, chills and unexpected weight change.  HENT: Negative for hearing loss, ear pain, nosebleeds, congestion, sore throat, facial swelling, rhinorrhea, sneezing, mouth sores, trouble swallowing, neck pain, neck stiffness, voice change, postnasal drip, sinus pressure, tinnitus and ear discharge.   Eyes: Negative for pain, discharge, redness and visual disturbance.  Respiratory: Negative for cough, chest tightness, shortness of breath, wheezing and stridor.   Cardiovascular: Negative for chest pain, palpitations and leg swelling.  Musculoskeletal: Positive for back pain. Negative for myalgias and arthralgias.  Skin: Negative for color change and rash.  Neurological: Negative for dizziness, weakness, light-headedness and headaches.  Hematological: Negative for adenopathy.  Psychiatric/Behavioral: Positive for sleep disturbance. The patient is nervous/anxious.       BP 182/88  Pulse 60  Temp(Src) 98.1 F (36.7  C) (Oral)  Wt 175 lb (79.379 kg)  SpO2 95%     Objective:   Physical Exam  Constitutional: She is oriented to person, place, and time. She appears well-developed and well-nourished.  HENT:  Right Ear: A middle ear effusion is present.  Left Ear: A middle ear effusion is present.  Nose: Right sinus exhibits maxillary sinus tenderness and frontal sinus tenderness. Left sinus exhibits maxillary sinus tenderness and frontal sinus tenderness.  Mouth/Throat: Oropharynx is clear and moist.  Eyes: EOM are normal. Pupils are equal, round, and reactive to light. No scleral icterus.  Neck: Normal range of motion. Neck supple. No JVD present. No  thyromegaly present.  Cardiovascular: Normal rate, regular rhythm, normal heart sounds and intact distal pulses.   Pulmonary/Chest: Effort normal and breath sounds normal.  Abdominal: Soft. Bowel sounds are normal. She exhibits no mass. There is no tenderness.  Musculoskeletal: Normal range of motion. She exhibits no edema.  Lymphadenopathy:    She has no cervical adenopathy.  Neurological: She is alert and oriented to person, place, and time.  Skin: Skin is warm and dry.  Psychiatric: She has a normal mood and affect.       Assessment & Plan:   Sinusitis acute Likely the cause of all of her symptoms given her exam.  Levaquin, prednisone a topical decongestants,   Hypertension Because of her recent percieved drug reaction to metoprolol, will taper to off and start amlodipine .     Updated Medication List Outpatient Encounter Prescriptions as of 08/28/2011  Medication Sig Dispense Refill  . Doxepin HCl (SILENOR) 3 MG TABS Take 1 tablet (3 mg total) by mouth at bedtime.  4 tablet  0  . loratadine (CLARITIN) 10 MG tablet Take 10 mg by mouth daily.        . metoprolol (LOPRESSOR) 50 MG tablet Take 1 tablet (50 mg total) by mouth 2 (two) times daily.  180 tablet  1  . morphine (MS CONTIN) 15 MG 12 hr tablet Take 15 mg by mouth 2 (two) times daily.        Marland Kitchen nystatin (MYCOSTATIN) powder Apply 100,000 g topically 2 (two) times daily.        Marland Kitchen omeprazole (PRILOSEC) 40 MG capsule Take 1 capsule (40 mg total) by mouth 2 (two) times daily.  60 capsule  11  . oxyCODONE-acetaminophen (ROXICET) 5-325 MG per tablet Take 1 tablet by mouth every 6 (six) hours as needed for pain.  90 tablet  0  . polyethylene glycol (MIRALAX / GLYCOLAX) packet Take 17 g by mouth daily.        . sucralfate (CARAFATE) 1 G tablet Take 1 tablet (1 g total) by mouth 4 (four) times daily.  120 tablet  1  . zolpidem (AMBIEN CR) 12.5 MG CR tablet Take 1 tablet (12.5 mg total) by mouth at bedtime as needed for sleep.  30  tablet  3  . amLODipine (NORVASC) 10 MG tablet Take 1 tablet (10 mg total) by mouth daily.  90 tablet  3  . diphenoxylate-atropine (LOMOTIL) 2.5-0.025 MG per tablet Take 1 tablet by mouth 4 (four) times daily as needed for diarrhea or loose stools.  30 tablet  2  . hydrALAZINE (APRESOLINE) 50 MG tablet Take 50 mg by mouth 4 (four) times daily.        Marland Kitchen levofloxacin (LEVAQUIN) 500 MG tablet Take 1 tablet (500 mg total) by mouth daily.  7 tablet  0  . predniSONE (STERAPRED UNI-PAK) 10  MG tablet 6 tablets on day 1, decrease by tablet daily until gone  21 tablet  0

## 2011-09-01 DIAGNOSIS — J019 Acute sinusitis, unspecified: Secondary | ICD-10-CM | POA: Insufficient documentation

## 2011-09-01 NOTE — Assessment & Plan Note (Signed)
Because of her recent percieved drug reaction to metoprolol, will taper to off and start amlodipine .

## 2011-09-01 NOTE — Assessment & Plan Note (Signed)
Likely the cause of all of her symptoms given her exam.  Levaquin, prednisone a topical decongestants,

## 2011-09-02 ENCOUNTER — Telehealth: Payer: Self-pay | Admitting: Internal Medicine

## 2011-09-02 MED ORDER — PREDNISONE (PAK) 10 MG PO TABS: ORAL_TABLET | ORAL | Status: DC

## 2011-09-02 MED ORDER — HYDRALAZINE HCL 50 MG PO TABS: 50.0000 mg | ORAL_TABLET | Freq: Three times a day (TID) | ORAL | Status: DC

## 2011-09-02 NOTE — Telephone Encounter (Signed)
i woul like to prescribe a prednisone taper for the allergic reaction and have her resume the hydralazine.,  I will send both rxs to her pharmacy

## 2011-09-02 NOTE — Telephone Encounter (Signed)
Advised pt.  She asked if she should restart the metoprolol also, advised not yet.

## 2011-09-02 NOTE — Telephone Encounter (Signed)
Patient has had an allergic reaction to her blood pressure medication she was seen in the ER on  Friday night at 9:00 1.18.2013 . Was released and told to take Benadryl and follow up with her primary care doctor. She doesn't believe her blood pressure has gone down. The inside of her mouth has swollen also.

## 2011-09-02 NOTE — Telephone Encounter (Signed)
Ok, please add amlodipine to her list of allergies.  Please ask her if she has resumed the metoprolol or would she like to resume the hydralazine first>  The dose for hydralazine is 50 mg three times daily

## 2011-09-02 NOTE — Telephone Encounter (Signed)
Spoke with patient, she hasnt started back taking anything yet. She would like your opinion on what she should do.  She says her BP is still high, still has itching, breaking out in her mouth, migraines, sore throat.

## 2011-09-08 ENCOUNTER — Telehealth: Payer: Self-pay | Admitting: Internal Medicine

## 2011-09-08 NOTE — Telephone Encounter (Signed)
Pt continues to have a possible reaction to her blood pressure.  Her gums are swollen, she cant wear her dentures.  She is taking benedryl, has had 2 rounds of prednisone, she took the last of this yesterday. Nothing has helped.  This has been going on for about 3 weeks.  She feels very shakey, thinks her BP is up and has taken a tranxene to help her calm down.

## 2011-09-08 NOTE — Telephone Encounter (Signed)
Patient's face is swollen she can hardly see her gums. Doesn't know what to do. She says she looks like a gold fish.

## 2011-09-11 ENCOUNTER — Ambulatory Visit (INDEPENDENT_AMBULATORY_CARE_PROVIDER_SITE_OTHER): Payer: Medicare Other | Admitting: Internal Medicine

## 2011-09-11 ENCOUNTER — Encounter: Payer: Self-pay | Admitting: Internal Medicine

## 2011-09-11 DIAGNOSIS — I10 Essential (primary) hypertension: Secondary | ICD-10-CM

## 2011-09-11 DIAGNOSIS — Z888 Allergy status to other drugs, medicaments and biological substances status: Secondary | ICD-10-CM

## 2011-09-11 DIAGNOSIS — T7840XA Allergy, unspecified, initial encounter: Secondary | ICD-10-CM

## 2011-09-11 MED ORDER — HYOSCYAMINE SULFATE 0.125 MG SL SUBL: 0.1250 mg | SUBLINGUAL_TABLET | SUBLINGUAL | Status: AC | PRN

## 2011-09-11 MED ORDER — HYDRALAZINE HCL 50 MG PO TABS: 100.0000 mg | ORAL_TABLET | Freq: Three times a day (TID) | ORAL | Status: DC

## 2011-09-11 MED ORDER — FUROSEMIDE 20 MG PO TABS: 20.0000 mg | ORAL_TABLET | Freq: Every day | ORAL | Status: DC

## 2011-09-11 NOTE — Patient Instructions (Signed)
We will increase your hydralazine to 100 mg three times daily for your blood pressure  We are adding furosemide 20 mg daily in the morning for swelling and blood pressure

## 2011-09-12 ENCOUNTER — Encounter: Payer: Self-pay | Admitting: Internal Medicine

## 2011-09-12 DIAGNOSIS — T7840XA Allergy, unspecified, initial encounter: Secondary | ICD-10-CM | POA: Insufficient documentation

## 2011-09-12 NOTE — Progress Notes (Signed)
Subjective:    Patient ID: Autumn Wright, female    DOB: 02/10/1935, 77 y.o.   MRN: [id number]  HPI  Autumn Wright. he seen urgently today for a two-week history of facial swelling including eyes, his lips and neck which apparently started after a change in her medication from metoprolol to amlodipine. The change was made because she was experiencing some chest tightness with a respiratory respiratory infection and had read that metoprolol can cause a problem. After switching to amlodipine she developed facial swelling and was actually seen last Friday at the ER due to the severity of her swelling. She was apparently evaluated by a Dr. Iline Oven who December preliminary laboratory evaluation told her she was fine to go home and take Benadryl. She waited several hours to be released from the ER and was very disgruntled. Today she states that her swelling has improved markedly but the swelling around her eyes was still so significant at the time of her driver's license evaluation that she it did affect her vision. She stopped the amlodipine immediately and was given prednisone by me as a taper and did finish this.  She continues to have chronic insomnia and is getting only 3-4 hours of sleep using Ambien CR at night.  Past Medical History  Diagnosis Date  . Restless leg syndrome   . Arthritis   . Osteoporosis   . Migraine since age 72  . Hypertension   . Anxiety   . Gastritis, chronic   . Malnutrition   . Anemia   . Chronic kidney disease    Current Outpatient Prescriptions on File Prior to Visit  Medication Sig Dispense Refill  . loratadine (CLARITIN) 10 MG tablet Take 10 mg by mouth daily.        Marland Kitchen nystatin (MYCOSTATIN) powder Apply 100,000 g topically 2 (two) times daily.        Marland Kitchen omeprazole (PRILOSEC) 40 MG capsule Take 1 capsule (40 mg total) by mouth 2 (two) times daily.  60 capsule  11  . oxyCODONE-acetaminophen (ROXICET) 5-325 MG per tablet Take 1 tablet by mouth every 6 (six)  hours as needed for pain.  90 tablet  0  . polyethylene glycol (MIRALAX / GLYCOLAX) packet Take 17 g by mouth daily.        . sucralfate (CARAFATE) 1 G tablet Take 1 tablet (1 g total) by mouth 4 (four) times daily.  120 tablet  1  . zolpidem (AMBIEN CR) 12.5 MG CR tablet Take 1 tablet (12.5 mg total) by mouth at bedtime as needed for sleep.  30 tablet  3  . morphine (MS CONTIN) 15 MG 12 hr tablet Take 15 mg by mouth 2 (two) times daily.           Review of Systems  Constitutional: Negative for fever, chills and unexpected weight change.  HENT: Negative for hearing loss, ear pain, nosebleeds, congestion, sore throat, facial swelling, rhinorrhea, sneezing, mouth sores, trouble swallowing, neck pain, neck stiffness, voice change, postnasal drip, sinus pressure, tinnitus and ear discharge.   Eyes: Negative for pain, discharge, redness and visual disturbance.  Respiratory: Negative for cough, chest tightness, shortness of breath, wheezing and stridor.   Cardiovascular: Negative for chest pain, palpitations and leg swelling.  Musculoskeletal: Positive for back pain. Negative for myalgias and arthralgias.  Skin: Negative for color change and rash.  Neurological: Negative for dizziness, weakness, light-headedness and headaches.  Hematological: Negative for adenopathy.  Psychiatric/Behavioral: Positive for sleep disturbance. The patient is  nervous/anxious.        Objective:   Physical Exam  Constitutional: She is oriented to person, place, and time. She appears well-developed and well-nourished.  HENT:  Right Ear: No middle ear effusion.  Left Ear:  No middle ear effusion.  Nose: Right sinus exhibits no maxillary sinus tenderness and no frontal sinus tenderness. Left sinus exhibits no maxillary sinus tenderness and no frontal sinus tenderness.  Mouth/Throat: Oropharynx is clear and moist.  Eyes: EOM are normal. Pupils are equal, round, and reactive to light. No scleral icterus.  Neck: Normal  range of motion. Neck supple. No JVD present. No thyromegaly present.  Cardiovascular: Normal rate, regular rhythm, normal heart sounds and intact distal pulses.   Pulmonary/Chest: Effort normal and breath sounds normal.  Abdominal: Soft. Bowel sounds are normal. She exhibits no mass. There is no tenderness.  Musculoskeletal: Normal range of motion. She exhibits no edema.  Lymphadenopathy:    She has no cervical adenopathy.  Neurological: She is alert and oriented to person, place, and time.  Skin: Skin is warm and dry.  Psychiatric: She has a normal mood and affect.          Assessment & Plan:   Allergic drug reaction Her exam is relatively normal today. Her recent episode of swelling apparently was determined with the pain. Since she continues to have some swelling which is causing trouble wearing her dentures, we'll add daily furosemide to manage the edema.  Hypertension Not well controlled since stopping the Toprol. Will increase hydralazine to 100 mg 3 times daily and add furosemide 20 mg daily. Will discuss with Dr. Thedore Mins regarding resuming losartan for blood pressure control.    Updated Medication List Outpatient Encounter Prescriptions as of 09/11/2011  Medication Sig Dispense Refill  . hydrALAZINE (APRESOLINE) 50 MG tablet Take 2 tablets (100 mg total) by mouth 3 (three) times daily.  90 tablet  3  . loratadine (CLARITIN) 10 MG tablet Take 10 mg by mouth daily.        Marland Kitchen nystatin (MYCOSTATIN) powder Apply 100,000 g topically 2 (two) times daily.        Marland Kitchen omeprazole (PRILOSEC) 40 MG capsule Take 1 capsule (40 mg total) by mouth 2 (two) times daily.  60 capsule  11  . oxyCODONE-acetaminophen (ROXICET) 5-325 MG per tablet Take 1 tablet by mouth every 6 (six) hours as needed for pain.  90 tablet  0  . polyethylene glycol (MIRALAX / GLYCOLAX) packet Take 17 g by mouth daily.        . sucralfate (CARAFATE) 1 G tablet Take 1 tablet (1 g total) by mouth 4 (four) times daily.  120  tablet  1  . zolpidem (AMBIEN CR) 12.5 MG CR tablet Take 1 tablet (12.5 mg total) by mouth at bedtime as needed for sleep.  30 tablet  3  . DISCONTD: Doxepin HCl (SILENOR) 3 MG TABS Take 1 tablet (3 mg total) by mouth at bedtime.  4 tablet  0  . DISCONTD: hydrALAZINE (APRESOLINE) 50 MG tablet Take 1 tablet (50 mg total) by mouth 3 (three) times daily.  90 tablet  3  . DISCONTD: metoprolol (LOPRESSOR) 50 MG tablet Take 1 tablet (50 mg total) by mouth 2 (two) times daily.  180 tablet  1  . furosemide (LASIX) 20 MG tablet Take 1 tablet (20 mg total) by mouth daily.  30 tablet  3  . hyoscyamine (LEVSIN SL) 0.125 MG SL tablet Place 1 tablet (0.125 mg total) under the  tongue every 4 (four) hours as needed for cramping.  60 tablet  0  . morphine (MS CONTIN) 15 MG 12 hr tablet Take 15 mg by mouth 2 (two) times daily.        Marland Kitchen DISCONTD: amLODipine (NORVASC) 10 MG tablet Take 1 tablet (10 mg total) by mouth daily.  90 tablet  3  . DISCONTD: predniSONE (STERAPRED UNI-PAK) 10 MG tablet 6 tablets on day 1, decrease by tablet daily until gone  21 tablet  0

## 2011-09-12 NOTE — Assessment & Plan Note (Signed)
Not well controlled since stopping the Toprol. Will increase hydralazine to 100 mg 3 times daily and add furosemide 20 mg daily. Will discuss with Dr. Thedore Mins regarding resuming losartan for blood pressure control.

## 2011-09-12 NOTE — Assessment & Plan Note (Signed)
Her exam is relatively normal today. Her recent episode of swelling apparently was determined with the pain. Since she continues to have some swelling which is causing trouble wearing her dentures, we'll add daily furosemide to manage the edema.

## 2011-09-24 ENCOUNTER — Telehealth: Payer: Self-pay | Admitting: Internal Medicine

## 2011-09-24 DIAGNOSIS — M549 Dorsalgia, unspecified: Secondary | ICD-10-CM

## 2011-09-24 MED ORDER — OXYCODONE-ACETAMINOPHEN 5-325 MG PO TABS: 1.0000 | ORAL_TABLET | Freq: Four times a day (QID) | ORAL | Status: DC | PRN

## 2011-09-24 NOTE — Telephone Encounter (Signed)
2 MONTHS ON PRINTER

## 2011-09-24 NOTE — Telephone Encounter (Signed)
Advised patient scripts are ready for pick up. 

## 2011-09-24 NOTE — Telephone Encounter (Signed)
[phone] Pt called to get refill for her oxycondone  Please call pt when ready to pick up

## 2011-09-25 ENCOUNTER — Ambulatory Visit (INDEPENDENT_AMBULATORY_CARE_PROVIDER_SITE_OTHER): Payer: Medicare Other | Admitting: Internal Medicine

## 2011-09-25 VITALS — BP 190/60 | HR 106

## 2011-09-25 DIAGNOSIS — I10 Essential (primary) hypertension: Secondary | ICD-10-CM

## 2011-09-25 NOTE — Progress Notes (Signed)
Pt came in for BP check after being told at [facility] that her BP was 189/169.  BP checked in the office first read 190/80 in left arm.  After a few minute rest it was 160/80 in left arm and 160/84 in right arm.  Pt feels as she normally does.  Per Dr. Darrick Huntsman advised her to go home and we would call her with medicine adjustments after Dr. Darrick Huntsman reviews her chart.

## 2011-09-28 ENCOUNTER — Telehealth: Payer: Self-pay | Admitting: Internal Medicine

## 2011-09-28 ENCOUNTER — Encounter: Payer: Self-pay | Admitting: Internal Medicine

## 2011-09-28 MED ORDER — CARVEDILOL 6.25 MG PO TABS: 6.2500 mg | ORAL_TABLET | Freq: Two times a day (BID) | ORAL | Status: DC

## 2011-09-28 NOTE — Progress Notes (Signed)
  Subjective:    Patient ID: Autumn Wright, female    DOB: [birth year], 77 y.o.   MRN: [id number]  HPI Autumn Wright is a 77 year old white female with a history of hypertension and chronic renal insufficiency who returns today for blood pressure check. After her last visit she had an adverse reaction to amlodipine and this medication was stopped. She is currently taking her hydralazine 3 times daily for blood pressure control. ACE inhibitor and ARB were stopped during last admission for rhabdomyolysis and acute renal failure resulting in chronic renal insufficiency.   Review of Systems     Objective:   Physical Exam        Assessment & Plan:   Hypertension She has had prior adverse reactions to amlodipine but on hydralazine alone her pressures are uncontrolled. Given her concurrent tachycardia we will attempt to start a beta blocker and use carvedilol 6.25 mg twice daily.    Updated Medication List Outpatient Encounter Prescriptions as of 09/25/2011  Medication Sig Dispense Refill  . carvedilol (COREG) 6.25 MG tablet Take 1 tablet (6.25 mg total) by mouth 2 (two) times daily with a meal.  60 tablet  3  . furosemide (LASIX) 20 MG tablet Take 1 tablet (20 mg total) by mouth daily.  30 tablet  3  . hydrALAZINE (APRESOLINE) 50 MG tablet Take 2 tablets (100 mg total) by mouth 3 (three) times daily.  90 tablet  3  . loratadine (CLARITIN) 10 MG tablet Take 10 mg by mouth daily.        Marland Kitchen morphine (MS CONTIN) 15 MG 12 hr tablet Take 15 mg by mouth 2 (two) times daily.        Marland Kitchen nystatin (MYCOSTATIN) powder Apply 100,000 g topically 2 (two) times daily.        Marland Kitchen omeprazole (PRILOSEC) 40 MG capsule Take 1 capsule (40 mg total) by mouth 2 (two) times daily.  60 capsule  11  . oxyCODONE-acetaminophen (ROXICET) 5-325 MG per tablet Take 1 tablet by mouth every 6 (six) hours as needed for pain.  90 tablet  0  . oxyCODONE-acetaminophen (ROXICET) 5-325 MG per tablet Take 1 tablet by mouth every 6 (six)  hours as needed for pain.  90 tablet  0  . polyethylene glycol (MIRALAX / GLYCOLAX) packet Take 17 g by mouth daily.        . sucralfate (CARAFATE) 1 G tablet Take 1 tablet (1 g total) by mouth 4 (four) times daily.  120 tablet  1

## 2011-09-28 NOTE — Assessment & Plan Note (Addendum)
She has had prior adverse reactions to amlodipine but on hydralazine alone her pressures are uncontrolled. Given her concurrent tachycardia we will attempt to start a beta blocker and use carvedilol 6.25 mg twice daily.

## 2011-09-28 NOTE — Telephone Encounter (Signed)
i am adding carvedilol for blood pressure management,  One tablet twice daily.,  Called to pharmacy.  continue hydralazine as well

## 2011-09-29 NOTE — Telephone Encounter (Signed)
Advised pt.  She has a new blood pressure cuff and will keep a check on her BP.

## 2011-10-13 ENCOUNTER — Ambulatory Visit (INDEPENDENT_AMBULATORY_CARE_PROVIDER_SITE_OTHER): Payer: Medicare Other | Admitting: Internal Medicine

## 2011-10-13 ENCOUNTER — Encounter: Payer: Self-pay | Admitting: Internal Medicine

## 2011-10-13 VITALS — BP 152/64 | HR 87 | Temp 98.6°F | Resp 16 | Ht 62.0 in | Wt 184.0 lb

## 2011-10-13 DIAGNOSIS — N189 Chronic kidney disease, unspecified: Secondary | ICD-10-CM

## 2011-10-13 DIAGNOSIS — D500 Iron deficiency anemia secondary to blood loss (chronic): Secondary | ICD-10-CM

## 2011-10-13 DIAGNOSIS — I10 Essential (primary) hypertension: Secondary | ICD-10-CM

## 2011-10-13 MED ORDER — CARVEDILOL 12.5 MG PO TABS: 12.5000 mg | ORAL_TABLET | Freq: Two times a day (BID) | ORAL | Status: DC

## 2011-10-13 NOTE — Assessment & Plan Note (Signed)
Not well controlled on current regimen.  Given CRI and prior reaction to amlodipine ,  Will increase carvedilol to 12. 5mg  bid.

## 2011-10-13 NOTE — Assessment & Plan Note (Signed)
Requiring transfusion during November hospitalization.  Following up with Dr. Orlie Dakin next month for rechecdk and Procrit.

## 2011-10-13 NOTE — Patient Instructions (Addendum)
Increase the carvedilol to 12. 5 mg twice daily.  You can start with the evening dose increase,  Followed by the morning dose after one week if you tolerate it.    Thank the firemen for me for checking your blood pressure and ask them to check your pulse with the next time they check it

## 2011-10-13 NOTE — Assessment & Plan Note (Signed)
Stable by recent repeat labs.

## 2011-10-13 NOTE — Progress Notes (Signed)
Subjective:    Patient ID: Autumn Wright, female    DOB: 1935/05/07, 77 y.o.   MRN: [id number]  HPI  Follow up on elevated blood  pressure readings.  Has been going to the fire dept to get her bp checked and the readings have been 190 to 170 systolic.  Diastolic 75 to 52.  No chest pain, shortness of breath.  Taking carvedilol 6.25 mg bid and hydralazine 100 mg bid. Prior intolerance ot amlodipine due to rash. Avoiding ARB/ACE due to history of acute renal failure in November 2011.  Renal function has recovered to baseline and is stable.  She also has follow up with [facility] for anemia next month.   Past Medical History  Diagnosis Date  . Restless leg syndrome   . Arthritis   . Osteoporosis   . Migraine since age 67  . Hypertension   . Anxiety   . Gastritis, chronic   . Malnutrition   . Anemia   . Chronic kidney disease    Current Outpatient Prescriptions on File Prior to Visit  Medication Sig Dispense Refill  . hydrALAZINE (APRESOLINE) 50 MG tablet Take 2 tablets (100 mg total) by mouth 3 (three) times daily.  90 tablet  3  . omeprazole (PRILOSEC) 40 MG capsule Take 1 capsule (40 mg total) by mouth 2 (two) times daily.  60 capsule  11  . oxyCODONE-acetaminophen (ROXICET) 5-325 MG per tablet Take 1 tablet by mouth every 6 (six) hours as needed for pain.  90 tablet  0  . polyethylene glycol (MIRALAX / GLYCOLAX) packet Take 17 g by mouth daily.        . sucralfate (CARAFATE) 1 G tablet Take 1 tablet (1 g total) by mouth 4 (four) times daily.  120 tablet  1    Review of Systems  Constitutional: Negative for fever, chills and unexpected weight change.  HENT: Negative for hearing loss, ear pain, nosebleeds, congestion, sore throat, facial swelling, rhinorrhea, sneezing, mouth sores, trouble swallowing, neck pain, neck stiffness, voice change, postnasal drip, sinus pressure, tinnitus and ear discharge.   Eyes: Negative for pain, discharge, redness and visual disturbance.  Respiratory:  Negative for cough, chest tightness, shortness of breath, wheezing and stridor.   Cardiovascular: Negative for chest pain, palpitations and leg swelling.  Musculoskeletal: Positive for back pain and arthralgias. Negative for myalgias.  Skin: Negative for color change and rash.  Neurological: Negative for dizziness, weakness, light-headedness and headaches.  Hematological: Negative for adenopathy.      BP 152/64  Pulse 87  Temp(Src) 98.6 F (37 C) (Oral)  Resp 16  Ht 5\' 2"  (1.575 m)  Wt 184 lb (83.462 kg)  BMI 33.65 kg/m2     Objective:   Physical Exam  Constitutional: She is oriented to person, place, and time. She appears well-developed and well-nourished.  HENT:  Mouth/Throat: Oropharynx is clear and moist.  Eyes: EOM are normal. Pupils are equal, round, and reactive to light. No scleral icterus.  Neck: Normal range of motion. Neck supple. No JVD present. No thyromegaly present.  Cardiovascular: Normal rate, regular rhythm, normal heart sounds and intact distal pulses.   Pulmonary/Chest: Effort normal and breath sounds normal.  Abdominal: Soft. Bowel sounds are normal. She exhibits no mass. There is no tenderness.  Musculoskeletal: Normal range of motion. She exhibits no edema.  Lymphadenopathy:    She has no cervical adenopathy.  Neurological: She is alert and oriented to person, place, and time.  Skin: Skin is warm and  dry.  Psychiatric: She has a normal mood and affect.      Assessment & Plan:   Hypertension Not well controlled on current regimen.  Given CRI and prior reaction to amlodipine ,  Will increase carvedilol to 12. 5mg  bid.     Updated Medication List Outpatient Encounter Prescriptions as of 10/13/2011  Medication Sig Dispense Refill  . carvedilol (COREG) 12.5 MG tablet Take 1 tablet (12.5 mg total) by mouth 2 (two) times daily with a meal.  60 tablet  3  . hydrALAZINE (APRESOLINE) 50 MG tablet Take 2 tablets (100 mg total) by mouth 3 (three) times daily.   90 tablet  3  . omeprazole (PRILOSEC) 40 MG capsule Take 1 capsule (40 mg total) by mouth 2 (two) times daily.  60 capsule  11  . oxyCODONE-acetaminophen (ROXICET) 5-325 MG per tablet Take 1 tablet by mouth every 6 (six) hours as needed for pain.  90 tablet  0  . polyethylene glycol (MIRALAX / GLYCOLAX) packet Take 17 g by mouth daily.        . sucralfate (CARAFATE) 1 G tablet Take 1 tablet (1 g total) by mouth 4 (four) times daily.  120 tablet  1  . DISCONTD: carvedilol (COREG) 6.25 MG tablet Take 1 tablet (6.25 mg total) by mouth 2 (two) times daily with a meal.  60 tablet  3  . DISCONTD: furosemide (LASIX) 20 MG tablet Take 1 tablet (20 mg total) by mouth daily.  30 tablet  3  . DISCONTD: loratadine (CLARITIN) 10 MG tablet Take 10 mg by mouth daily.        Marland Kitchen DISCONTD: morphine (MS CONTIN) 15 MG 12 hr tablet Take 15 mg by mouth 2 (two) times daily.        Marland Kitchen DISCONTD: nystatin (MYCOSTATIN) powder Apply 100,000 g topically 2 (two) times daily.        Marland Kitchen DISCONTD: oxyCODONE-acetaminophen (ROXICET) 5-325 MG per tablet Take 1 tablet by mouth every 6 (six) hours as needed for pain.  90 tablet  0

## 2011-10-16 ENCOUNTER — Ambulatory Visit: Payer: Self-pay | Admitting: Oncology

## 2011-10-16 LAB — CBC CANCER CENTER
Basophil #: 0 x10 3/mm (ref 0.0–0.1)
Basophil %: 0.5 %
Eosinophil #: 0.1 x10 3/mm (ref 0.0–0.7)
HCT: 31.7 % — ABNORMAL LOW (ref 35.0–47.0)
Lymphocyte #: 1.4 x10 3/mm (ref 1.0–3.6)
Lymphocyte %: 39 %
MCH: 35.1 pg — ABNORMAL HIGH (ref 26.0–34.0)
MCHC: 34 g/dL (ref 32.0–36.0)
MCV: 103 fL — ABNORMAL HIGH (ref 80–100)
Monocyte #: 0.3 x10 3/mm (ref 0.0–0.7)
Neutrophil #: 1.8 x10 3/mm (ref 1.4–6.5)
Neutrophil %: 49.2 %
Platelet: 201 x10 3/mm (ref 150–440)
RBC: 3.07 10*6/uL — ABNORMAL LOW (ref 3.80–5.20)
RDW: 14.9 % — ABNORMAL HIGH (ref 11.5–14.5)
WBC: 3.6 x10 3/mm (ref 3.6–11.0)

## 2011-11-10 ENCOUNTER — Ambulatory Visit: Payer: Self-pay | Admitting: Oncology

## 2011-11-24 ENCOUNTER — Other Ambulatory Visit: Payer: Self-pay | Admitting: Internal Medicine

## 2011-11-24 MED ORDER — SUMATRIPTAN SUCCINATE 50 MG PO TABS: 50.0000 mg | ORAL_TABLET | ORAL | Status: DC | PRN

## 2011-12-01 LAB — CBC CANCER CENTER
Basophil #: 0.1 x10 3/mm (ref 0.0–0.1)
Eosinophil #: 0 x10 3/mm (ref 0.0–0.7)
Lymphocyte %: 33.5 %
MCH: 32.7 pg (ref 26.0–34.0)
MCHC: 32.1 g/dL (ref 32.0–36.0)
Monocyte %: 8.8 %
Neutrophil #: 3.4 x10 3/mm (ref 1.4–6.5)
Neutrophil %: 56.3 %
RBC: 3.25 10*6/uL — ABNORMAL LOW (ref 3.80–5.20)
RDW: 13.6 % (ref 11.5–14.5)

## 2011-12-10 ENCOUNTER — Ambulatory Visit: Payer: Self-pay | Admitting: Oncology

## 2011-12-11 ENCOUNTER — Other Ambulatory Visit: Payer: Self-pay | Admitting: Internal Medicine

## 2011-12-11 MED ORDER — HYDRALAZINE HCL 50 MG PO TABS: 100.0000 mg | ORAL_TABLET | Freq: Three times a day (TID) | ORAL | Status: DC

## 2011-12-11 NOTE — Telephone Encounter (Signed)
[phone] Pt need refill on hydralizine 50mg  Dr Darrick Huntsman changed to 2  3 xdaily Old rx say 1   3 x daily [facility]

## 2011-12-16 ENCOUNTER — Other Ambulatory Visit: Payer: Self-pay | Admitting: Internal Medicine

## 2011-12-16 NOTE — Telephone Encounter (Signed)
Zolpidem 12.5 mg patient is completley out and needs a refill on this medication today.

## 2011-12-17 MED ORDER — ZOLPIDEM TARTRATE ER 12.5 MG PO TBCR: 12.5000 mg | EXTENDED_RELEASE_TABLET | Freq: Every evening | ORAL | Status: DC | PRN

## 2011-12-26 ENCOUNTER — Emergency Department: Payer: Self-pay | Admitting: Emergency Medicine

## 2011-12-26 ENCOUNTER — Other Ambulatory Visit: Payer: Self-pay | Admitting: Internal Medicine

## 2011-12-26 DIAGNOSIS — I10 Essential (primary) hypertension: Secondary | ICD-10-CM

## 2011-12-26 LAB — COMPREHENSIVE METABOLIC PANEL
Alkaline Phosphatase: 97 U/L (ref 50–136)
Anion Gap: 6 — ABNORMAL LOW (ref 7–16)
Bilirubin,Total: 0.3 mg/dL (ref 0.2–1.0)
Calcium, Total: 8.8 mg/dL (ref 8.5–10.1)
Creatinine: 1.41 mg/dL — ABNORMAL HIGH (ref 0.60–1.30)
EGFR (African American): 42 — ABNORMAL LOW
Glucose: 122 mg/dL — ABNORMAL HIGH (ref 65–99)
SGOT(AST): 39 U/L — ABNORMAL HIGH (ref 15–37)
SGPT (ALT): 19 U/L
Total Protein: 7.1 g/dL (ref 6.4–8.2)

## 2011-12-26 LAB — URINALYSIS, COMPLETE
Bilirubin,UR: NEGATIVE
Blood: NEGATIVE
Leukocyte Esterase: NEGATIVE
Nitrite: NEGATIVE
Protein: 100
RBC,UR: NONE SEEN /HPF (ref 0–5)
Specific Gravity: 1.023 (ref 1.003–1.030)
Squamous Epithelial: 2
WBC UR: 1 /HPF (ref 0–5)

## 2011-12-26 LAB — CBC
HGB: 10.1 g/dL — ABNORMAL LOW (ref 12.0–16.0)
MCHC: 33.3 g/dL (ref 32.0–36.0)
Platelet: 232 10*3/uL (ref 150–440)
RBC: 3.01 10*6/uL — ABNORMAL LOW (ref 3.80–5.20)
RDW: 13.7 % (ref 11.5–14.5)

## 2011-12-26 MED ORDER — CARVEDILOL 12.5 MG PO TABS: 12.5000 mg | ORAL_TABLET | Freq: Two times a day (BID) | ORAL | Status: DC

## 2011-12-29 ENCOUNTER — Telehealth: Payer: Self-pay | Admitting: Internal Medicine

## 2011-12-29 ENCOUNTER — Telehealth: Payer: Self-pay

## 2011-12-29 MED ORDER — DIPHENOXYLATE-ATROPINE 2.5-0.025 MG/5ML PO LIQD: 5.0000 mL | Freq: Four times a day (QID) | ORAL | Status: DC | PRN

## 2011-12-29 MED ORDER — ONDANSETRON HCL 8 MG PO TABS: 8.0000 mg | ORAL_TABLET | Freq: Three times a day (TID) | ORAL | Status: AC | PRN

## 2011-12-29 NOTE — Telephone Encounter (Signed)
rxs for lomotil liquid for the diarrhea and zofran for the nausea. Please call in

## 2011-12-29 NOTE — Telephone Encounter (Signed)
Patient's son notified as instructed by telephone. [facility] Dr Darrick Huntsman said OK to change to Lomotil tabs. Taking 2 tabs every 4 hrs as needed for loose stool(maximum 4 tabs a day.)If med does not help or any sign of dehydration pts son will call back for appt.

## 2011-12-29 NOTE — Telephone Encounter (Signed)
Pt son call back. i told him that dr Darrick Huntsman ok to call in some rx for his mom and that you would call him when this is done

## 2011-12-29 NOTE — Telephone Encounter (Signed)
error 

## 2011-12-29 NOTE — Telephone Encounter (Signed)
Pt seen [facility] ER on 12/26/11 with N&V, diarrhea and upper abdominal cramping. Pt was given IV fluids at ER and given Ondansetron 4 mg dissolving tabs for N&V. Last vomited 12/27/11 but still nauseated. Pt said Ondansetron helps with N&V and pt only has one pill left. Pt has had diarrhea x 4 this AM. Pt is able to eat and drink today; no known fever; Pt has upper abdominal pain and cramping on and off. Now pain level 0 but when has pain is an 8. Pt uses [facility] and pt can be reached at [phone].

## 2012-01-02 ENCOUNTER — Emergency Department: Payer: Self-pay | Admitting: Emergency Medicine

## 2012-01-02 LAB — COMPREHENSIVE METABOLIC PANEL
Albumin: 2.9 g/dL — ABNORMAL LOW (ref 3.4–5.0)
BUN: 20 mg/dL — ABNORMAL HIGH (ref 7–18)
Bilirubin,Total: 0.3 mg/dL (ref 0.2–1.0)
Chloride: 109 mmol/L — ABNORMAL HIGH (ref 98–107)
Co2: 26 mmol/L (ref 21–32)
Creatinine: 1.39 mg/dL — ABNORMAL HIGH (ref 0.60–1.30)
EGFR (African American): 43 — ABNORMAL LOW
EGFR (Non-African Amer.): 37 — ABNORMAL LOW
Glucose: 98 mg/dL (ref 65–99)
Osmolality: 289 (ref 275–301)
SGOT(AST): 55 U/L — ABNORMAL HIGH (ref 15–37)

## 2012-01-02 LAB — TROPONIN I: Troponin-I: <0.02 ng/mL

## 2012-01-02 LAB — URINALYSIS, COMPLETE
Glucose,UR: NEGATIVE mg/dL (ref 0–75)
Ketone: NEGATIVE
Leukocyte Esterase: NEGATIVE
Ph: 7 (ref 4.5–8.0)
Protein: 30
Specific Gravity: 1.014 (ref 1.003–1.030)

## 2012-01-02 LAB — CBC
HCT: 32.5 % — ABNORMAL LOW (ref 35.0–47.0)
HGB: 10.7 g/dL — ABNORMAL LOW (ref 12.0–16.0)
Platelet: 177 10*3/uL (ref 150–440)
RBC: 3.21 10*6/uL — ABNORMAL LOW (ref 3.80–5.20)
RDW: 14.6 % — ABNORMAL HIGH (ref 11.5–14.5)

## 2012-01-02 LAB — LIPASE, BLOOD: Lipase: 164 U/L (ref 73–393)

## 2012-01-06 ENCOUNTER — Telehealth: Payer: Self-pay | Admitting: Internal Medicine

## 2012-01-06 NOTE — Telephone Encounter (Signed)
please have her make an appt.  I cannot decide anything without an evaluation.

## 2012-01-06 NOTE — Telephone Encounter (Signed)
Pt called she went [facility] er  5/24 they did a scan. The first dr clear it then another dr called after they went home stated that he thought there was a growth on her liver.  They wanted her to call dr Darrick Huntsman to see what dr Darrick Huntsman wanted her to do. Please advise

## 2012-01-07 NOTE — Telephone Encounter (Signed)
Called and spoke with patient. Appt made

## 2012-01-12 ENCOUNTER — Encounter: Payer: Self-pay | Admitting: Internal Medicine

## 2012-01-12 ENCOUNTER — Ambulatory Visit (INDEPENDENT_AMBULATORY_CARE_PROVIDER_SITE_OTHER): Payer: Medicare Other | Admitting: Internal Medicine

## 2012-01-12 VITALS — BP 128/70 | HR 83 | Temp 98.4°F | Resp 18 | Wt 177.2 lb

## 2012-01-12 DIAGNOSIS — G4700 Insomnia, unspecified: Secondary | ICD-10-CM

## 2012-01-12 DIAGNOSIS — F5104 Psychophysiologic insomnia: Secondary | ICD-10-CM

## 2012-01-12 DIAGNOSIS — K2940 Chronic atrophic gastritis without bleeding: Secondary | ICD-10-CM

## 2012-01-12 DIAGNOSIS — R932 Abnormal findings on diagnostic imaging of liver and biliary tract: Secondary | ICD-10-CM

## 2012-01-12 DIAGNOSIS — K2950 Unspecified chronic gastritis without bleeding: Secondary | ICD-10-CM

## 2012-01-12 MED ORDER — ONDANSETRON 4 MG PO TBDP: 4.0000 mg | ORAL_TABLET | Freq: Three times a day (TID) | ORAL | Status: AC | PRN

## 2012-01-12 NOTE — Progress Notes (Signed)
Patient ID: Autumn Wright, female   DOB: 1934/09/25, 77 y.o.   MRN: [id number]  Patient Active Problem List  Diagnoses  . Hypertension  . Pulmonary edema  . Malnutrition  . Anemia due to blood loss, chronic  . Gastritis, chronic  . Chronic kidney disease  . Restless legs syndrome  . Insomnia, psychophysiological  . Sinusitis acute  . Allergic drug reaction  . Abnormal CT of liver    Subjective:  CC:   Chief Complaint  Patient presents with  . Follow-up    HPI:   Autumn Wright is a 77 y.o. female who presents for ER follow up on liver mass and possible pancreatic mass seen on orally contrasted CT done May 25 during 2nd ER visit for persistent nausea and vomiting, with weight loss of 7 lbs in the last 2 months.  She was evalauted by Dr. Reyes Ivan NP this morning and an MRI of abdomen  and pelvis is scheduled for Friday .  She stopped all medications except antihypertensives and hypnotics over one week ago. She denies abdominal pain.  Her vomiting has resolved, and she has been ale to [employer] a bland diet and gatorade for hydration since May 25.  The zofran ODT has been helpful for controlling the nausea but she is running out.  Has not slept in days despite use of ambien due to increase in family stressors.     Past Medical History  Diagnosis Date  . Restless leg syndrome   . Arthritis   . Osteoporosis   . Migraine since age 89  . Hypertension   . Anxiety   . Gastritis, chronic   . Malnutrition   . Anemia   . Chronic kidney disease     Past Surgical History  Procedure Date  . Abdominal hysterectomy    The following portions of the patient's history were reviewed and updated as appropriate: Allergies, current medications, and problem list.    Review of Systems:   12 Pt  review of systems was negative except those addressed in the HPI,     History   Social History  . Marital Status: Widowed    Spouse Name: N/A    Number of Children: N/A  . Years of  Education: N/A   Occupational History  . Not on file.   Social History Main Topics  . Smoking status: Never Smoker   . Smokeless tobacco: Never Used  . Alcohol Use: No  . Drug Use: No  . Sexually Active: Not on file   Other Topics Concern  . Not on file   Social History Narrative  . No narrative on file    Objective:  BP 128/70  Pulse 83  Temp(Src) 98.4 F (36.9 C) (Oral)  Resp 18  Wt 177 lb 4 oz (80.4 kg)  SpO2 94%  General appearance: alert, cooperative and appears stated age Ears: normal TM's and external ear canals both ears Throat: lips, mucosa, and tongue normal; teeth and gums normal Neck: no adenopathy, no carotid bruit, supple, symmetrical, trachea midline and thyroid not enlarged, symmetric, no tenderness/mass/nodules Back: symmetric, no curvature. ROM normal. No CVA tenderness. Lungs: clear to auscultation bilaterally Heart: regular rate and rhythm, S1, S2 normal, no murmur, click, rub or gallop Abdomen: soft, non-tender; bowel sounds normal; no masses,  no organomegaly Pulses: 2+ and symmetric Skin: Skin color, texture, turgor normal. No rashes or lesions Lymph nodes: Cervical, supraclavicular, and axillary nodes normal.  Assessment and Plan:  Abnormal CT of liver  She is scheduled for MRI of abdomen this week by Dr. Orpah Cobb office.   Gastritis, chronic Recent episode now resolved but with chronic nausea managed with zofran.   Insomnia, psychophysiological Continue ambien, as prior trials of other sedatives have been unsuccessful.     Updated Medication List Outpatient Encounter Prescriptions as of 01/12/2012  Medication Sig Dispense Refill  . carvedilol (COREG) 12.5 MG tablet Take 1 tablet (12.5 mg total) by mouth 2 (two) times daily with a meal.  60 tablet  3  . hydrALAZINE (APRESOLINE) 50 MG tablet Take 2 tablets (100 mg total) by mouth 3 (three) times daily.  90 tablet  3  . rOPINIRole (REQUIP) 2 MG tablet Take 2 mg by mouth at bedtime.        Marland Kitchen zolpidem (AMBIEN CR) 12.5 MG CR tablet Take 1 tablet (12.5 mg total) by mouth at bedtime as needed for sleep.  30 tablet  2  . diphenoxylate-atropine (LOMOTIL) 2.5-0.025 MG per tablet Two tabs by mouth every 4 hours as needed after each loose stool  (maximum of 4 a day).      Marland Kitchen omeprazole (PRILOSEC) 40 MG capsule Take 1 capsule (40 mg total) by mouth 2 (two) times daily.  60 capsule  11  . ondansetron (ZOFRAN ODT) 4 MG disintegrating tablet Take 1 tablet (4 mg total) by mouth every 8 (eight) hours as needed for nausea.  30 tablet  3  . oxyCODONE-acetaminophen (ROXICET) 5-325 MG per tablet Take 1 tablet by mouth every 6 (six) hours as needed for pain.  90 tablet  0  . polyethylene glycol (MIRALAX / GLYCOLAX) packet Take 17 g by mouth daily.        . sucralfate (CARAFATE) 1 G tablet Take 1 tablet (1 g total) by mouth 4 (four) times daily.  120 tablet  1  . SUMAtriptan (IMITREX) 50 MG tablet Take 1 tablet (50 mg total) by mouth every 2 (two) hours as needed for migraine.  10 tablet  0  . DISCONTD: zolpidem (AMBIEN CR) 12.5 MG CR tablet Take 1 tablet (12.5 mg total) by mouth at bedtime as needed for sleep.  30 tablet  3     No orders of the defined types were placed in this encounter.    No Follow-up on file.

## 2012-01-12 NOTE — Assessment & Plan Note (Signed)
She is scheduled for MRI of abdomen this week by Dr. Orpah Cobb office.

## 2012-01-12 NOTE — Assessment & Plan Note (Signed)
Recent episode now resolved but with chronic nausea managed with zofran.

## 2012-01-12 NOTE — Assessment & Plan Note (Signed)
Continue ambien, as prior trials of other sedatives have been unsuccessful.

## 2012-01-16 ENCOUNTER — Ambulatory Visit: Payer: Self-pay | Admitting: Gastroenterology

## 2012-01-19 ENCOUNTER — Other Ambulatory Visit: Payer: Self-pay | Admitting: Internal Medicine

## 2012-01-20 ENCOUNTER — Emergency Department: Payer: Self-pay | Admitting: Unknown Physician Specialty

## 2012-01-20 LAB — URINALYSIS, COMPLETE
Ketone: NEGATIVE
Nitrite: NEGATIVE
RBC,UR: NONE SEEN /HPF (ref 0–5)
Squamous Epithelial: 1

## 2012-01-20 LAB — COMPREHENSIVE METABOLIC PANEL
Albumin: 2.9 g/dL — ABNORMAL LOW (ref 3.4–5.0)
Alkaline Phosphatase: 122 U/L (ref 50–136)
Anion Gap: 8 (ref 7–16)
BUN: 14 mg/dL (ref 7–18)
Bilirubin,Total: 0.2 mg/dL (ref 0.2–1.0)
Calcium, Total: 8.9 mg/dL (ref 8.5–10.1)
Chloride: 103 mmol/L (ref 98–107)
Co2: 27 mmol/L (ref 21–32)
Creatinine: 1.35 mg/dL — ABNORMAL HIGH (ref 0.60–1.30)
EGFR (African American): 44 — ABNORMAL LOW
EGFR (Non-African Amer.): 38 — ABNORMAL LOW
Glucose: 153 mg/dL — ABNORMAL HIGH (ref 65–99)
Osmolality: 279 (ref 275–301)
Potassium: 3.8 mmol/L (ref 3.5–5.1)
SGOT(AST): 38 U/L — ABNORMAL HIGH (ref 15–37)
SGPT (ALT): 23 U/L
Sodium: 138 mmol/L (ref 136–145)
Total Protein: 7 g/dL (ref 6.4–8.2)

## 2012-01-20 LAB — MAGNESIUM: Magnesium: 1.7 mg/dL — ABNORMAL LOW

## 2012-01-20 LAB — CBC
HCT: 30.4 % — ABNORMAL LOW (ref 35.0–47.0)
HGB: 10 g/dL — ABNORMAL LOW (ref 12.0–16.0)
MCH: 33 pg (ref 26.0–34.0)
MCHC: 32.8 g/dL (ref 32.0–36.0)
MCV: 101 fL — ABNORMAL HIGH (ref 80–100)
Platelet: 299 10*3/uL (ref 150–440)
RBC: 3.02 10*6/uL — ABNORMAL LOW (ref 3.80–5.20)
RDW: 15.1 % — ABNORMAL HIGH (ref 11.5–14.5)
WBC: 5.2 10*3/uL (ref 3.6–11.0)

## 2012-01-20 LAB — LIPASE, BLOOD: Lipase: 38 U/L — ABNORMAL LOW (ref 73–393)

## 2012-01-20 MED ORDER — CLORAZEPATE DIPOTASSIUM 7.5 MG PO TABS: 7.5000 mg | ORAL_TABLET | Freq: Every day | ORAL | Status: DC | PRN

## 2012-01-21 ENCOUNTER — Other Ambulatory Visit: Payer: Self-pay | Admitting: Internal Medicine

## 2012-01-22 ENCOUNTER — Telehealth: Payer: Self-pay | Admitting: Internal Medicine

## 2012-01-22 NOTE — Telephone Encounter (Signed)
Patient says that she can't find her Ambien and she hasn't slept in over 24 hrs. She is asking if she can get another refill on this?

## 2012-01-22 NOTE — Telephone Encounter (Signed)
Can't find her Zolpidem has not slept in 24 hrs. Could you call her in another prescription.

## 2012-01-23 ENCOUNTER — Ambulatory Visit: Payer: Self-pay | Admitting: Gastroenterology

## 2012-01-23 MED ORDER — ZOLPIDEM TARTRATE ER 12.5 MG PO TBCR: 12.5000 mg | EXTENDED_RELEASE_TABLET | Freq: Every evening | ORAL | Status: DC | PRN

## 2012-01-23 NOTE — Telephone Encounter (Signed)
Yes you can call #30 in, but warn her that she will have to pay out of pocket if it is an early refill.

## 2012-01-23 NOTE — Telephone Encounter (Signed)
Rx called to pharmacy. Patient notified.

## 2012-01-28 ENCOUNTER — Other Ambulatory Visit: Payer: Self-pay

## 2012-01-28 ENCOUNTER — Telehealth: Payer: Self-pay

## 2012-01-28 DIAGNOSIS — R160 Hepatomegaly, not elsewhere classified: Secondary | ICD-10-CM

## 2012-01-28 NOTE — Telephone Encounter (Signed)
Pt notified and instructed, meds have also been reviewed.  Instructions mailed to the pt home.  All questions were answered

## 2012-02-14 ENCOUNTER — Other Ambulatory Visit: Payer: Self-pay | Admitting: Internal Medicine

## 2012-02-19 ENCOUNTER — Ambulatory Visit: Payer: Self-pay | Admitting: Oncology

## 2012-02-19 ENCOUNTER — Encounter (HOSPITAL_COMMUNITY): Payer: Self-pay | Admitting: Anesthesiology

## 2012-02-19 ENCOUNTER — Ambulatory Visit (HOSPITAL_COMMUNITY)
Admission: RE | Admit: 2012-02-19 | Discharge: 2012-02-19 | Disposition: A | Discharge status: Home / Self Care | Payer: Medicare Other | Source: Ambulatory Visit | Attending: Gastroenterology | Admitting: Gastroenterology

## 2012-02-19 ENCOUNTER — Encounter (HOSPITAL_COMMUNITY): Payer: Self-pay | Admitting: Gastroenterology

## 2012-02-19 ENCOUNTER — Telehealth: Payer: Self-pay | Admitting: Internal Medicine

## 2012-02-19 ENCOUNTER — Ambulatory Visit (HOSPITAL_COMMUNITY): Payer: Medicare Other | Admitting: Anesthesiology

## 2012-02-19 ENCOUNTER — Encounter (HOSPITAL_COMMUNITY)
Admission: RE | Disposition: A | Discharge status: Home / Self Care | Payer: Self-pay | Source: Ambulatory Visit | Attending: Gastroenterology

## 2012-02-19 DIAGNOSIS — D649 Anemia, unspecified: Secondary | ICD-10-CM | POA: Insufficient documentation

## 2012-02-19 DIAGNOSIS — N189 Chronic kidney disease, unspecified: Secondary | ICD-10-CM | POA: Insufficient documentation

## 2012-02-19 DIAGNOSIS — M810 Age-related osteoporosis without current pathological fracture: Secondary | ICD-10-CM | POA: Insufficient documentation

## 2012-02-19 DIAGNOSIS — I129 Hypertensive chronic kidney disease with stage 1 through stage 4 chronic kidney disease, or unspecified chronic kidney disease: Secondary | ICD-10-CM | POA: Insufficient documentation

## 2012-02-19 DIAGNOSIS — R932 Abnormal findings on diagnostic imaging of liver and biliary tract: Secondary | ICD-10-CM

## 2012-02-19 DIAGNOSIS — E46 Unspecified protein-calorie malnutrition: Secondary | ICD-10-CM | POA: Insufficient documentation

## 2012-02-19 DIAGNOSIS — K869 Disease of pancreas, unspecified: Secondary | ICD-10-CM | POA: Insufficient documentation

## 2012-02-19 DIAGNOSIS — R933 Abnormal findings on diagnostic imaging of other parts of digestive tract: Secondary | ICD-10-CM

## 2012-02-19 DIAGNOSIS — G2581 Restless legs syndrome: Secondary | ICD-10-CM | POA: Insufficient documentation

## 2012-02-19 DIAGNOSIS — M549 Dorsalgia, unspecified: Secondary | ICD-10-CM

## 2012-02-19 DIAGNOSIS — M129 Arthropathy, unspecified: Secondary | ICD-10-CM | POA: Insufficient documentation

## 2012-02-19 DIAGNOSIS — K2940 Chronic atrophic gastritis without bleeding: Secondary | ICD-10-CM | POA: Insufficient documentation

## 2012-02-19 DIAGNOSIS — C228 Malignant neoplasm of liver, primary, unspecified as to type: Secondary | ICD-10-CM | POA: Insufficient documentation

## 2012-02-19 HISTORY — PX: EUS: SHX5427

## 2012-02-19 SURGERY — UPPER ENDOSCOPIC ULTRASOUND (EUS) LINEAR
Anesthesia: Monitor Anesthesia Care

## 2012-02-19 MED ORDER — FENTANYL CITRATE 0.05 MG/ML IJ SOLN
INTRAMUSCULAR | Status: DC | PRN
  Administered 2012-02-19: 100 ug via INTRAVENOUS

## 2012-02-19 MED ORDER — OXYCODONE-ACETAMINOPHEN 5-325 MG PO TABS: 1.0000 | ORAL_TABLET | Freq: Four times a day (QID) | ORAL | Status: AC | PRN

## 2012-02-19 MED ORDER — MIDAZOLAM HCL 5 MG/5ML IJ SOLN
INTRAMUSCULAR | Status: DC | PRN
  Administered 2012-02-19: 2 mg via INTRAVENOUS

## 2012-02-19 MED ORDER — PROPOFOL 10 MG/ML IV EMUL
INTRAVENOUS | Status: DC | PRN
  Administered 2012-02-19: 50 ug/kg/min via INTRAVENOUS

## 2012-02-19 MED ORDER — LACTATED RINGERS IV SOLN
INTRAVENOUS | Status: DC
  Administered 2012-02-19: 1000 mL via INTRAVENOUS

## 2012-02-19 MED ORDER — PROPOFOL 10 MG/ML IV EMUL
INTRAVENOUS | Status: DC | PRN
  Administered 2012-02-19: 20 mg via INTRAVENOUS

## 2012-02-19 NOTE — Anesthesia Postprocedure Evaluation (Signed)
Anesthesia Post Note  Patient: Autumn Wright  Procedure(s) Performed: Procedure(s) (LRB): UPPER ENDOSCOPIC ULTRASOUND (EUS) LINEAR (N/A)  Anesthesia type: MAC  Patient location: PACU  Post pain: Pain level controlled  Post assessment: Post-op Vital signs reviewed  Last Vitals:  Filed Vitals:   02/19/12 1210  BP: 100/62  Pulse: 54  Temp:   Resp: 11    Post vital signs: Reviewed  Level of consciousness: sedated  Complications: No apparent anesthesia complications

## 2012-02-19 NOTE — Anesthesia Preprocedure Evaluation (Addendum)
Anesthesia Evaluation  Patient identified by MRN, date of birth, ID band Patient awake    Reviewed: Allergy & Precautions, H&P , NPO status , Patient's Chart, lab work & pertinent test results  Airway Mallampati: II TM Distance: >3 FB Neck ROM: Full    Dental  (+) Poor Dentition, Partial Upper and Edentulous Lower   Pulmonary shortness of breath,  breath sounds clear to auscultation  Pulmonary exam normal       Cardiovascular hypertension, Pt. on medications and Pt. on home beta blockers negative cardio ROS  Rate:Normal     Neuro/Psych  Headaches, Anxiety negative neurological ROS  negative psych ROS   GI/Hepatic negative GI ROS, Neg liver ROS, GERD-  Medicated,  Endo/Other  negative endocrine ROS  Renal/GU negative Renal ROS  negative genitourinary   Musculoskeletal negative musculoskeletal ROS (+)   Abdominal   Peds  Hematology negative hematology ROS (+)   Anesthesia Other Findings   Reproductive/Obstetrics negative OB ROS                          Anesthesia Physical Anesthesia Plan  ASA: II  Anesthesia Plan: MAC   Post-op Pain Management:    Induction: Intravenous  Airway Management Planned: Nasal Cannula  Additional Equipment:   Intra-op Plan:   Post-operative Plan:   Informed Consent: I have reviewed the patients History and Physical, chart, labs and discussed the procedure including the risks, benefits and alternatives for the proposed anesthesia with the patient or authorized representative who has indicated his/her understanding and acceptance.   Dental advisory given  Plan Discussed with: CRNA  Anesthesia Plan Comments:         Anesthesia Quick Evaluation

## 2012-02-19 NOTE — Telephone Encounter (Signed)
Refill on Oxycodone 5-325 mg

## 2012-02-19 NOTE — Preoperative (Signed)
Beta Blockers   Reason not to administer Beta Blockers:pt took lopressor this am 

## 2012-02-19 NOTE — H&P (Signed)
  HPI: This is a woman referred by Dr. Marva Panda with mass in liver (left lobe) and ?possible irregular pancreas.    Past Medical History  Diagnosis Date  . Restless leg syndrome   . Arthritis   . Osteoporosis   . Migraine since age 77  . Hypertension   . Anxiety   . Gastritis, chronic   . Malnutrition   . Anemia   . Chronic kidney disease     Past Surgical History  Procedure Date  . Abdominal hysterectomy     Current Facility-Administered Medications  Medication Dose Route Frequency Provider Last Rate Last Dose  . lactated ringers infusion   Intravenous Continuous Rachael Fee, MD 125 mL/hr at 02/19/12 0933 1,000 mL at 02/19/12 0933    Allergies as of 01/28/2012 - Review Complete 01/12/2012  Allergen Reaction Noted  . Amlodipine  09/12/2011  . Amoxicillin  07/15/2011  . Sulfa antibiotics  07/15/2011    Family History  Problem Relation Age of Onset  . Alcohol abuse Sister   . Alcohol abuse Brother     History   Social History  . Marital Status: Widowed    Spouse Name: N/A    Number of Children: N/A  . Years of Education: N/A   Occupational History  . Not on file.   Social History Main Topics  . Smoking status: Never Smoker   . Smokeless tobacco: Never Used  . Alcohol Use: No  . Drug Use: No  . Sexually Active: Not on file   Other Topics Concern  . Not on file   Social History Narrative  . No narrative on file      Physical Exam: BP 129/70  Pulse 60  Temp 97.5 F (36.4 C) (Oral)  Resp 14  SpO2 96% Constitutional: generally well-appearing Psychiatric: alert and oriented x3 Abdomen: soft, nontender, nondistended, no obvious ascites, no peritoneal signs, normal bowel sounds     Assessment and plan: 77 y.o. female with mass in liver, possibly irregular pancreas  For upper EUS today

## 2012-02-19 NOTE — Op Note (Signed)
[facility] [address] [place], [place]  [address]  ENDOSCOPIC ULTRASOUND PROCEDURE REPORT  PATIENT:  Autumn Wright, Autumn Wright  MR#:  [id number] BIRTHDATE:  July 22, 1935  GENDER:  female ENDOSCOPIST:  Rachael Fee, MD Referring:  Barnetta Chapel, MD at [facility] GI PROCEDURE DATE:  02/19/2012 PROCEDURE:  Upper EUS w/FNA ASA CLASS:  Class II INDICATIONS:  mass in liver (4-5cm) and somewhat irregular pancreas on MRI and CT; CA 19-9 normal, AFP 70 MEDICATIONS:   MAC sedation, administered by CRNA  DESCRIPTION OF PROCEDURE:   After the risks, benefits, and alternatives of the procedure were thoroughly explained, informed consent was obtained.  The Pentax Radial EUS [id number] and PENTAX EUS SCOPE endoscope was introduced through the mouth and advanced to the second portion of the duodenum. <<PROCEDUREIMAGES>>  Endoscopic findings: 1. Normal UGI tract  EUS findings: 1. Pancreatic parenchyma was normal throughout gland; no discrete masses or signs of chronic pancreatitis 2. Normal main pancreatic duct 3. CBD normal, non-dilated 4. Centrally within the Left Lobe of Liver, there was somewhat hyperchoic mass measuring 4.5cm with diffusely irregular outer margins.  The mass clearly invades intrahepatic portal vein and extends into main portal vein.  The mass was sampled with 2 passes of a 25 gauge BS EUS FNA needle using color Dopper. 5. No periportal, perigastric adenopathy  Impression: 4.5cm mass centrally within the left lobe of liver that invades intrahepatic portal vein, extending at least 1-2cm into main portal vein.  The mass was sampled with FNA, preliminary cytology is positive for malignancy, likely hepatocellular carcinoma.  I spoke with Bjorn Loser from Dr. Bluford Kaufmann office and they are going to refer her to [facility].  ______________________________ Rachael Fee, MD  n. eSIGNED:   Rachael Fee at 02/19/2012 12:09 PM  Cherre Robins,  [phone]

## 2012-02-19 NOTE — Transfer of Care (Signed)
Immediate Anesthesia Transfer of Care Note  Patient: Autumn Wright  Procedure(s) Performed: Procedure(s) (LRB): UPPER ENDOSCOPIC ULTRASOUND (EUS) LINEAR (N/A)  Patient Location: Endoscopy Unit  Anesthesia Type: MAC  Level of Consciousness: awake and alert   Airway & Oxygen Therapy: Patient Spontanous Breathing and Patient connected to nasal cannula oxygen  Post-op Assessment: Report given to PACU RN and Post -op Vital signs reviewed and stable  Post vital signs: Reviewed and stable  Complications: No apparent anesthesia complications

## 2012-02-22 ENCOUNTER — Encounter (HOSPITAL_COMMUNITY): Payer: Self-pay | Admitting: Gastroenterology

## 2012-02-23 ENCOUNTER — Telehealth: Payer: Self-pay | Admitting: Internal Medicine

## 2012-02-23 LAB — CBC CANCER CENTER
Basophil #: 0.1 x10 3/mm (ref 0.0–0.1)
Basophil %: 0.8 %
Eosinophil #: 0.2 x10 3/mm (ref 0.0–0.7)
Eosinophil %: 3 %
HGB: 10.1 g/dL — ABNORMAL LOW (ref 12.0–16.0)
Lymphocyte #: 2.8 x10 3/mm (ref 1.0–3.6)
Lymphocyte %: 41.8 %
MCH: 32.6 pg (ref 26.0–34.0)
MCHC: 31.7 g/dL — ABNORMAL LOW (ref 32.0–36.0)
Monocyte %: 10.1 %
Neutrophil %: 44.3 %
RBC: 3.09 10*6/uL — ABNORMAL LOW (ref 3.80–5.20)
RDW: 16.1 % — ABNORMAL HIGH (ref 11.5–14.5)

## 2012-02-23 LAB — COMPREHENSIVE METABOLIC PANEL
Alkaline Phosphatase: 115 U/L (ref 50–136)
Anion Gap: 9 (ref 7–16)
BUN: 17 mg/dL (ref 7–18)
Bilirubin,Total: 0.2 mg/dL (ref 0.2–1.0)
Chloride: 106 mmol/L (ref 98–107)
Co2: 28 mmol/L (ref 21–32)
EGFR (African American): 38 — ABNORMAL LOW
EGFR (Non-African Amer.): 32 — ABNORMAL LOW
SGOT(AST): 47 U/L — ABNORMAL HIGH (ref 15–37)
SGPT (ALT): 26 U/L
Total Protein: 7.4 g/dL (ref 6.4–8.2)

## 2012-02-23 MED ORDER — PANTOPRAZOLE SODIUM 40 MG PO TBEC: 40.0000 mg | DELAYED_RELEASE_TABLET | Freq: Every day | ORAL | Status: AC

## 2012-02-23 NOTE — Telephone Encounter (Signed)
Refill on Pantoprazole DOD DR 40 MG TAB

## 2012-02-24 LAB — AFP TUMOR MARKER: AFP-Tumor Marker: 136.1 ng/mL — ABNORMAL HIGH (ref 0.0–8.3)

## 2012-03-02 ENCOUNTER — Ambulatory Visit: Payer: Self-pay | Admitting: Oncology

## 2012-03-11 ENCOUNTER — Telehealth: Payer: Self-pay | Admitting: Internal Medicine

## 2012-03-11 ENCOUNTER — Ambulatory Visit: Payer: Self-pay | Admitting: Oncology

## 2012-03-11 ENCOUNTER — Other Ambulatory Visit: Payer: Self-pay | Admitting: Internal Medicine

## 2012-03-11 MED ORDER — HYDRALAZINE HCL 50 MG PO TABS: 50.0000 mg | ORAL_TABLET | Freq: Three times a day (TID) | ORAL | Status: AC

## 2012-03-11 NOTE — Telephone Encounter (Signed)
Patient needs her hydralazine called into [facility] she states she is running really low on this medication. She also asked about her metoprolol and carvedilol I advised that they were sent today to [facility].

## 2012-03-11 NOTE — Telephone Encounter (Signed)
Rx called into pharmacy, patient notified.

## 2012-03-17 ENCOUNTER — Other Ambulatory Visit: Payer: Self-pay | Admitting: Internal Medicine

## 2012-03-17 NOTE — Telephone Encounter (Signed)
Patient called in she needs her zolpidem 12.5mg  called into [facility]. She states that they are going to fax over request I told her that was our policy for her to call them first, which she did but they told her to call our office.

## 2012-03-18 MED ORDER — ZOLPIDEM TARTRATE ER 12.5 MG PO TBCR: 12.5000 mg | EXTENDED_RELEASE_TABLET | Freq: Every evening | ORAL | Status: AC | PRN

## 2012-03-29 ENCOUNTER — Ambulatory Visit: Payer: Self-pay | Admitting: Surgery

## 2012-03-29 LAB — BASIC METABOLIC PANEL
Anion Gap: 7 (ref 7–16)
Chloride: 105 mmol/L (ref 98–107)
Co2: 29 mmol/L (ref 21–32)
Creatinine: 1.64 mg/dL — ABNORMAL HIGH (ref 0.60–1.30)
EGFR (African American): 35 — ABNORMAL LOW
Glucose: 123 mg/dL — ABNORMAL HIGH (ref 65–99)
Osmolality: 285 (ref 275–301)

## 2012-03-29 LAB — HEPATIC FUNCTION PANEL A (ARMC)
Albumin: 3.1 g/dL — ABNORMAL LOW (ref 3.4–5.0)
Bilirubin, Direct: <0.1 mg/dL (ref 0.00–0.20)
SGOT(AST): 31 U/L (ref 15–37)
SGPT (ALT): 21 U/L (ref 12–78)
Total Protein: 7.1 g/dL (ref 6.4–8.2)

## 2012-03-29 LAB — CBC WITH DIFFERENTIAL/PLATELET
Basophil #: 0 10*3/uL (ref 0.0–0.1)
Basophil %: 0.4 %
Eosinophil #: 0 10*3/uL (ref 0.0–0.7)
HCT: 29.8 % — ABNORMAL LOW (ref 35.0–47.0)
HGB: 9.7 g/dL — ABNORMAL LOW (ref 12.0–16.0)
Lymphocyte %: 26 %
MCHC: 32.7 g/dL (ref 32.0–36.0)
MCV: 100 fL (ref 80–100)
Monocyte %: 9 %
Neutrophil #: 4.2 10*3/uL (ref 1.4–6.5)
Neutrophil %: 64 %
Platelet: 168 10*3/uL (ref 150–440)
RBC: 2.97 10*6/uL — ABNORMAL LOW (ref 3.80–5.20)
RDW: 15.7 % — ABNORMAL HIGH (ref 11.5–14.5)

## 2012-03-29 LAB — APTT: Activated PTT: 24.7 secs (ref 23.6–35.9)

## 2012-03-29 LAB — PROTIME-INR: Prothrombin Time: 13.3 secs (ref 11.5–14.7)

## 2012-03-30 LAB — CEA: CEA: 4.6 ng/mL (ref 0.0–4.7)

## 2012-03-30 LAB — CANCER ANTIGEN 19-9: CA 19-9: <1 U/mL (ref 0–35)

## 2012-04-05 ENCOUNTER — Inpatient Hospital Stay: Payer: Self-pay | Admitting: Surgery

## 2012-04-06 LAB — CBC WITH DIFFERENTIAL/PLATELET
Basophil %: 0.2 %
Comment - H1-Com3: NORMAL
Eosinophil %: 0.1 %
HCT: 29.3 % — ABNORMAL LOW (ref 35.0–47.0)
HGB: 9.6 g/dL — ABNORMAL LOW (ref 12.0–16.0)
Lymphocyte %: 14.9 %
Lymphocytes: 14 %
MCH: 32.8 pg (ref 26.0–34.0)
MCHC: 33 g/dL (ref 32.0–36.0)
MCV: 99 fL (ref 80–100)
Monocyte #: 1.1 x10 3/mm — ABNORMAL HIGH (ref 0.2–0.9)
Monocytes: 5 %
Neutrophil #: 8.6 10*3/uL — ABNORMAL HIGH (ref 1.4–6.5)
Neutrophil %: 75.4 %
RBC: 2.94 10*6/uL — ABNORMAL LOW (ref 3.80–5.20)
Segmented Neutrophils: 80 %

## 2012-04-06 LAB — APTT: Activated PTT: 37.5 secs — ABNORMAL HIGH (ref 23.6–35.9)

## 2012-04-06 LAB — PATHOLOGY REPORT

## 2012-04-06 LAB — PROTIME-INR
INR: 1.1
Prothrombin Time: 15 secs — ABNORMAL HIGH (ref 11.5–14.7)

## 2012-04-06 LAB — COMPREHENSIVE METABOLIC PANEL
Albumin: 2.2 g/dL — ABNORMAL LOW (ref 3.4–5.0)
Alkaline Phosphatase: 93 U/L (ref 50–136)
Calcium, Total: 8.2 mg/dL — ABNORMAL LOW (ref 8.5–10.1)
EGFR (Non-African Amer.): 31 — ABNORMAL LOW
Glucose: 177 mg/dL — ABNORMAL HIGH (ref 65–99)
Osmolality: 287 (ref 275–301)
SGOT(AST): 70 U/L — ABNORMAL HIGH (ref 15–37)
SGPT (ALT): 21 U/L (ref 12–78)
Sodium: 141 mmol/L (ref 136–145)
Total Protein: 6.2 g/dL — ABNORMAL LOW (ref 6.4–8.2)

## 2012-04-10 LAB — CREATININE, SERUM
Creatinine: 1.68 mg/dL — ABNORMAL HIGH (ref 0.60–1.30)
EGFR (African American): 34 — ABNORMAL LOW
EGFR (Non-African Amer.): 29 — ABNORMAL LOW

## 2012-04-11 ENCOUNTER — Ambulatory Visit: Payer: Self-pay | Admitting: Oncology

## 2012-04-14 ENCOUNTER — Telehealth: Payer: Self-pay | Admitting: Internal Medicine

## 2012-04-14 NOTE — Telephone Encounter (Signed)
Hospice called needing a hard copy prescription faxed to [phone] for OxyContin extended release 12 hrs.  1 tab bid po for pain dispense 90 tabs Hospice patient. Oxycodone Acetaminophen 5-325 mg po 2 tabs q4hrs prn for pain dispense 120 tabs Hospice patient.

## 2012-04-14 NOTE — Telephone Encounter (Signed)
Fine to fill. 

## 2012-04-14 NOTE — Telephone Encounter (Signed)
Order has been faxed back.

## 2012-04-16 ENCOUNTER — Ambulatory Visit: Payer: Self-pay | Admitting: Oncology

## 2012-04-22 ENCOUNTER — Telehealth: Payer: Self-pay | Admitting: Internal Medicine

## 2012-04-22 NOTE — Telephone Encounter (Signed)
Drinda Butts from [facility] called to inform the physician that Autumn Wright is going to seek Therapeutic treatment and that as of today 9.12.13 they are revoking hospice services .

## 2012-04-26 ENCOUNTER — Other Ambulatory Visit: Payer: Self-pay | Admitting: Internal Medicine

## 2012-04-26 MED ORDER — CLORAZEPATE DIPOTASSIUM 7.5 MG PO TABS: 7.5000 mg | ORAL_TABLET | Freq: Every day | ORAL | Status: AC | PRN

## 2012-04-29 ENCOUNTER — Telehealth: Payer: Self-pay | Admitting: Internal Medicine

## 2012-04-29 NOTE — Telephone Encounter (Signed)
Left message on machine at home for Shell to return call.

## 2012-04-29 NOTE — Telephone Encounter (Signed)
Absolutely,  Nothing needed to do  Except let us know what needs refilling.

## 2012-04-29 NOTE — Telephone Encounter (Signed)
Pt and son in law came in today.  Pt is having a hard time getting her meds from dr Ginnie Smart  [facility] and having a diffcult to get in touch with them.  Shell (son in law) wanted to know if you would be a back up for her meds if they cannot get in touch with dr Ginnie Smart office   Made appointment for 9/24 Please advise what pt needs to do

## 2012-04-29 NOTE — Telephone Encounter (Signed)
Spoke with Shell and advised as instructed, advised him to not let patient run out of her medications.  Patient is now living with Shell and I updated the address in the computer.

## 2012-05-04 ENCOUNTER — Ambulatory Visit: Payer: Medicare Other | Admitting: Internal Medicine

## 2012-05-10 ENCOUNTER — Telehealth: Payer: Self-pay | Admitting: Internal Medicine

## 2012-05-10 NOTE — Telephone Encounter (Signed)
Autumn Wright called to let you know that [name] passed away yesterday morning at home

## 2012-05-11 ENCOUNTER — Ambulatory Visit: Payer: Self-pay | Admitting: Oncology

## 2012-05-11 DEATH — deceased

## 2012-09-18 IMAGING — CT CT ABDOMEN AND PELVIS WITHOUT AND WITH CONTRAST
2 of 5 series · 13 of 32 positions shown, 18 images · non-contrast
Comparison: none

REASON FOR EXAM: LABS 1st Abn CT A and P on 0646 in ER Pt has masses in
liver Eval follow up ...
COMMENTS:

[Series 2: without · axial · non-contrast · 0.67mm/px · z∈[-844,-589]mm · 6 of 155 slices shown]
[im 18/155  soft-tissue]
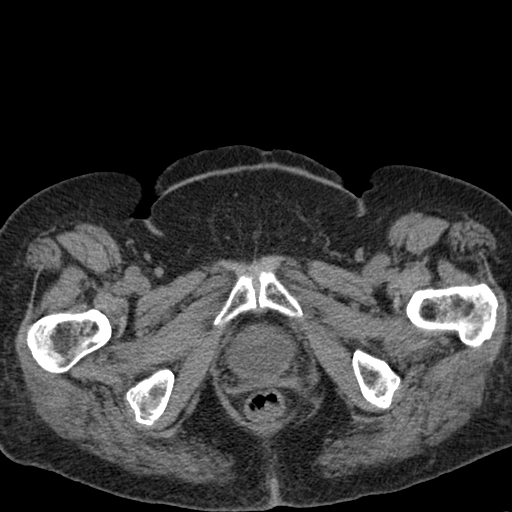
[im 35/155  soft-tissue]
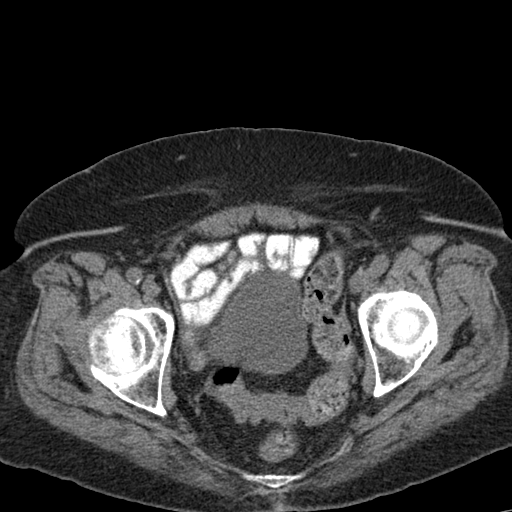
[im 52/155  soft-tissue]
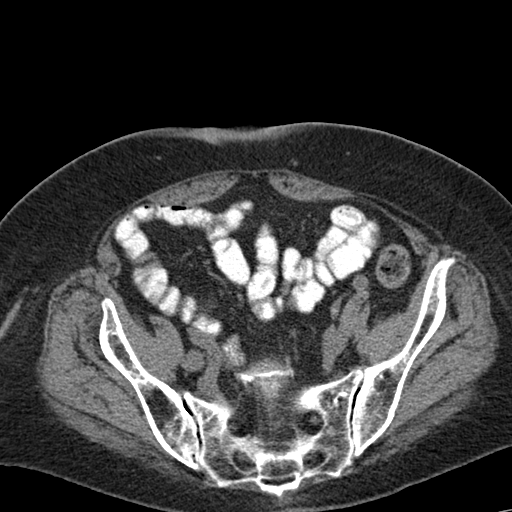
[im 69/155  soft-tissue]
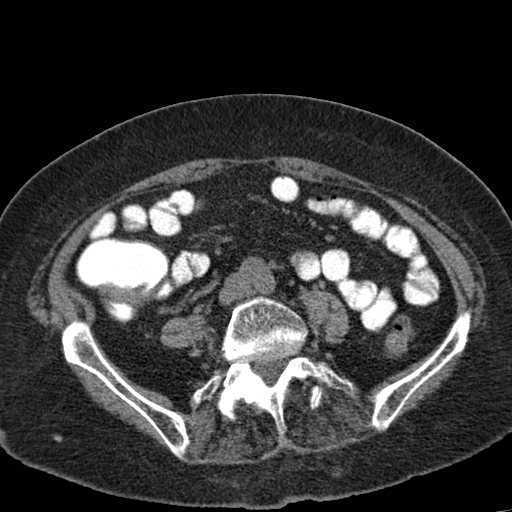
[im 86/155  soft-tissue]
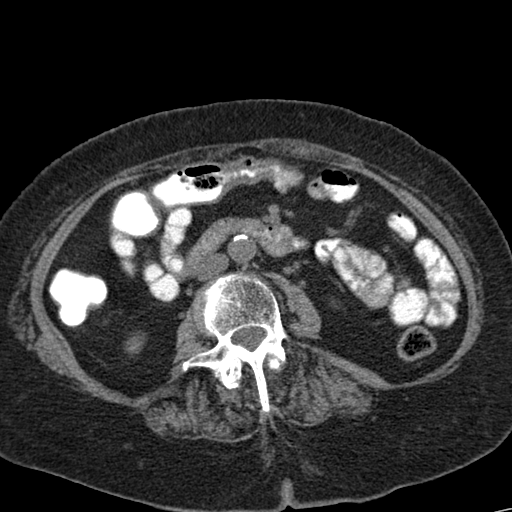
[im 103/155  soft-tissue]
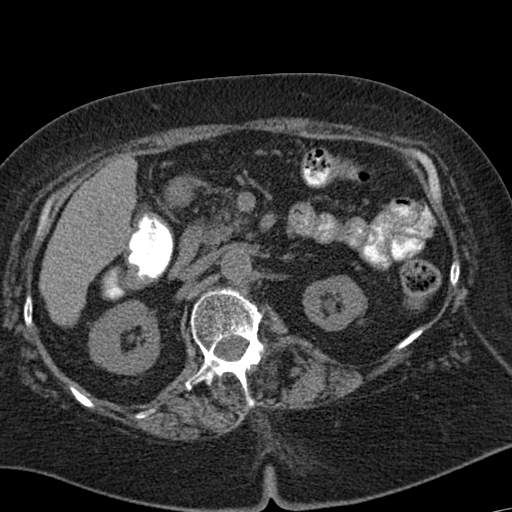

[Series 6: 70 sec · axial · 0.67mm/px · z∈[-844,-493]mm · 7 of 157 slices shown, 12 images]
[im 20/157  soft-tissue]
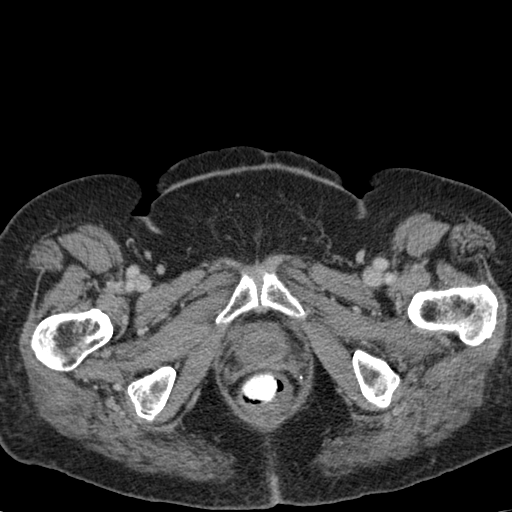
[im 20/157  bone]
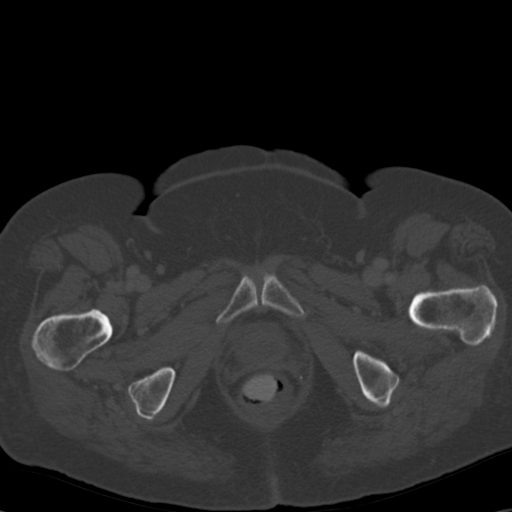
[im 40/157  soft-tissue]
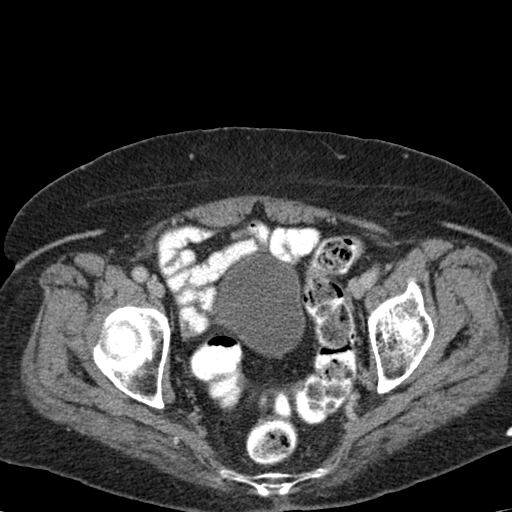
[im 59/157  soft-tissue]
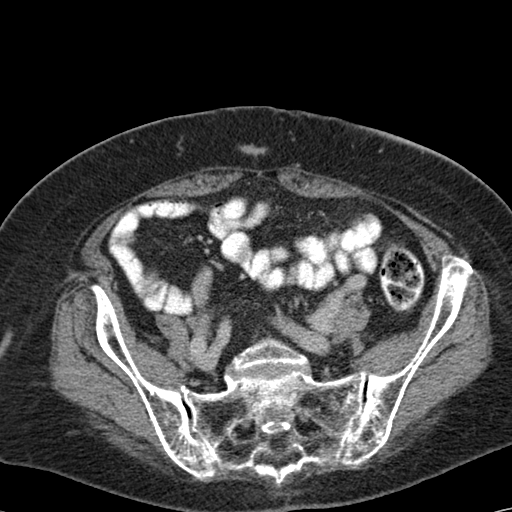
[im 79/157  soft-tissue]
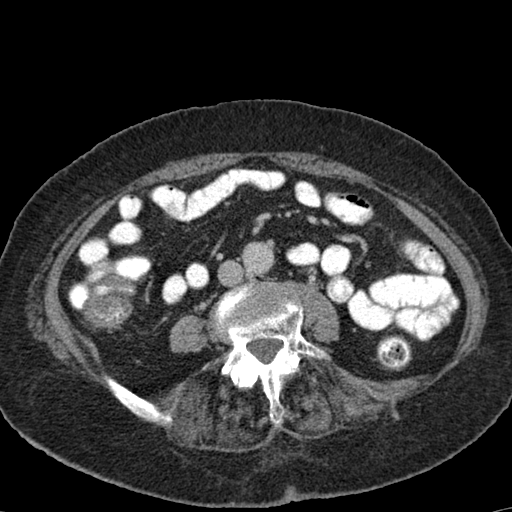
[im 79/157  lung]
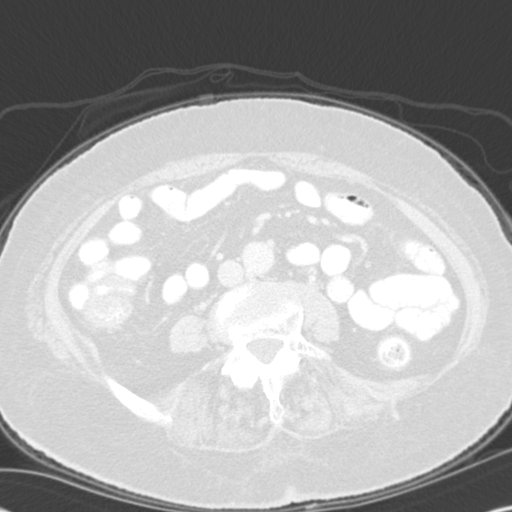
[im 98/157  soft-tissue]
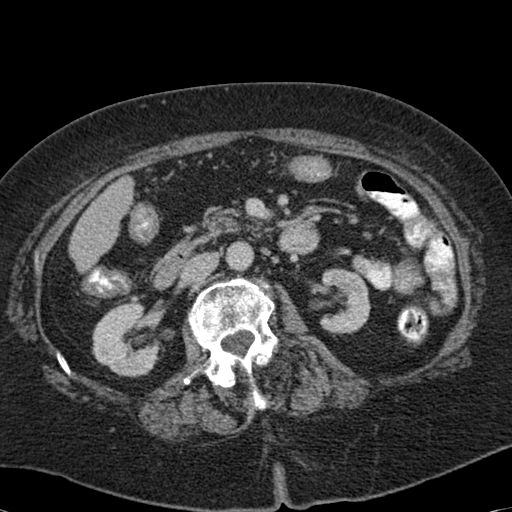
[im 98/157  lung]
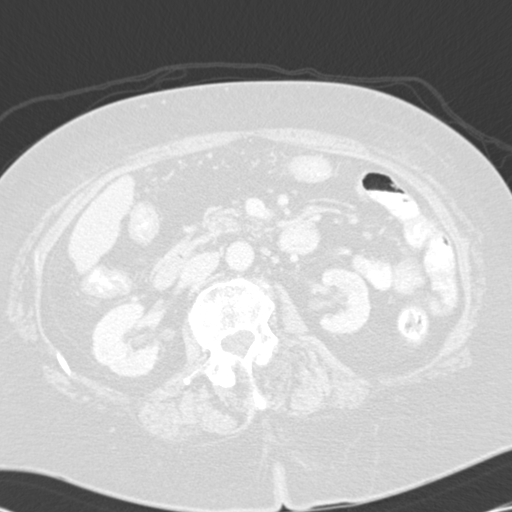
[im 118/157  soft-tissue]
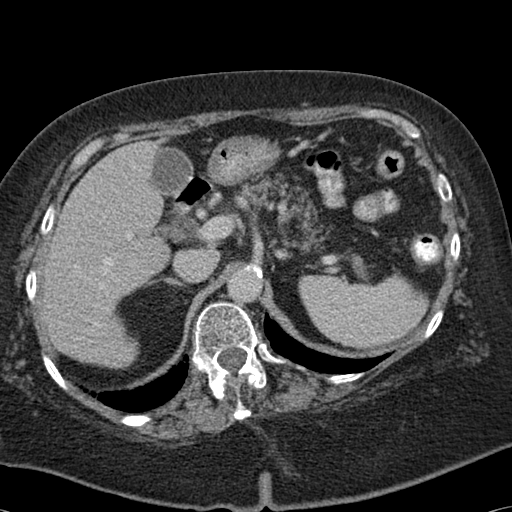
[im 118/157  lung]
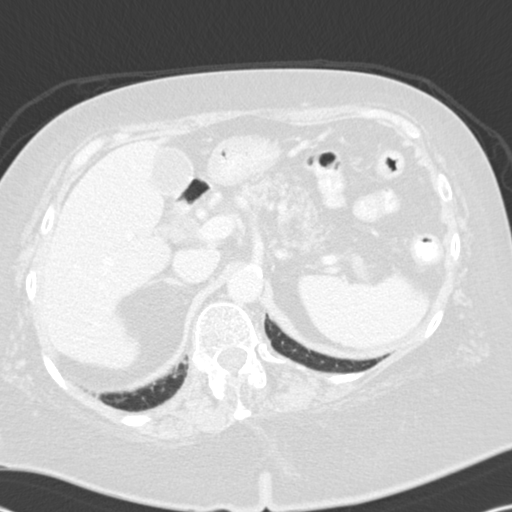
[im 137/157  soft-tissue]
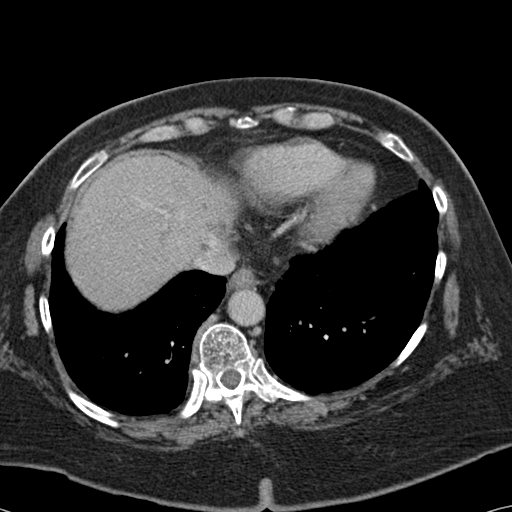
[im 137/157  lung]
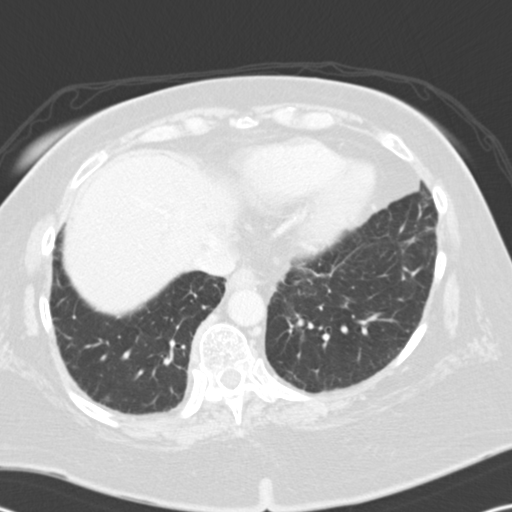

[13 of 32 positions shown; findings below may reference images not displayed]

PROCEDURE:     CT  - CT ABDOMEN / PELVIS  W/WO  - January 23, 2012 [DATE]

RESULT:     Triphasic CT of the abdomen and pelvis is performed. Comparison
is made to the previous noncontrast exam dated 03 January, 2012.

Lung bases show some areas of presumed fibrosis or atelectasis without a
focal infiltrate or a definite pulmonary mass.

Noncontrast images again show abnormal low attenuation within the
inferomedial aspect of the medial segment of the left lobe of the liver.
There is fatty infiltration of the pancreas. Images obtained following
injection of 100 mL of Tsovue-GII iodinated intravenous contrast in the
arterial and portal venous phases and in delayed equilibrium phases are
reconstructed at 3.0 mm slice thickness.

Postcontrast images show persistent abnormality in the left lobe of the
liver with some suggestion of ductal dilation and abnormal appearance of the
left portal vein and main portal vein bifurcation region concerning for
direct tumor invasion or tumor thrombus. A definite pancreatic head mass is
not seen but there is some abnormal attenuation and enhancement in the
pancreatic head. The uncinate does not show a definite mass. There is fatty
infiltration in the body and tail of the pancreas. No definite
cholelithiasis is seen. The right lobe of the liver appears to be
unremarkable. The other included abdominal viscera appear normal. There is
no adrenal mass. The kidneys show no mass or obstruction. The spleen is
unremarkable. The urinary bladder is within normal limits. No abnormal bowel
distention or wall thickening is evident. There is no retroperitoneal or
pelvic mass or adenopathy. There is a mild thoracolumbar scoliosis concave
to the left with slight compression of the L2 vertebral body.
IMPRESSION: 1. Cannot exclude a pancreatic head mass. Consider endoscopic ultrasound for
further assessment. There is fatty infiltration of the pancreas which is not
definitely seen in the pancreatic head. A definite uncinate process mass is
not appreciated.
2. Abnormal appearance in the porta hepatis region and left lobe of the
liver concerning for underlying malignancy. While metastatic disease is not
excluded this may represent a primary biliary or hepatic disease. There is
evidence of left portal vein and main portal vein filling defect which could
represent direct tumor invasion or thrombus. Small nodes are seen in the
porta hepatis region. There may be some mild left hepatic lobe biliary
ductal dilation.
3. Areas of fibrosis appear to be present in the lung bases.
4. Compression deformity of L2 of uncertain chronicity.

[REDACTED]

## 2014-11-28 NOTE — Discharge Summary (Signed)
PATIENT NAME:  Autumn Wright, Autumn Wright MR#:  [id number] DATE OF BIRTH:  09/25/1934  DATE OF ADMISSION:  04/05/2012 DATE OF DISCHARGE:  04/10/2012  PRINCIPLE DIAGNOSIS: Hepatocellular carcinoma, left lobe of liver, with main portal vein tumor thrombus.   OTHER DIAGNOSES:  1. Osteoarthritis. 2. Osteoporosis. 3. History of chronic back pain status post compression fractures.  4. Degenerative disk disease. 5. Diverticulosis. 6. History of gastritis. 7. Gastroesophageal reflux disease. 8. Diabetes mellitus. 9. Hypertension. 10. Chronic anemia. 11. History of rhabdomyolysis. 12. Status post partial hysterectomy.  PRINCIPLE PROCEDURE PERFORMED: Exploratory laparotomy, portal vein lymph node excision, intraoperative ultrasound of the liver, and left subclavian vein central venous catheter placement.   HOSPITAL COURSE: Ms. Kercheval underwent the above-mentioned procedure on 04/05/2012 and was admitted postoperatively. Unfortunately, 95% of her main portal vein had tumor thrombus within it with a very thin crescent moon of blood flow. She did not have any compromise of her intestine with this. In other words, her small intestine was not edematous or cyanotic, and she did not have any acute portal venous hypertensive problems. It was thought to be unresectable because of that, however. Postoperatively, she did well and after her pain was adequately controlled and she was started on a little bit of nighttime Haldol for hallucinations and difficulty sleeping, I had a very frank discussion with her and her family in a family meeting where we discussed all possible options including transarterial chemoembolization, sorafenib p.o., as well as referral to hospice for comfort care measures. She chose hospice and therefore was seen by Dr. Izora Gala Phifer from palliative medicine who facilitated transfer to hospice. She chose to be discharged home to her daughter's house in [place] and arrangements will be made for her to  receive home hospice there. She will return to see me in the office next Friday for staple removal. At the time of discharge, her medications were essentially unchanged from what she took as she came into the hospital, with the exception of the fact that her narcotics were adjusted to oxycodone 10 mg every 12 hours scheduled and Tylox 5/325 mg one to two every 4 hours p.r.n. breakthrough pain. In addition, she was given a prescription for Haldol 2 mg at bedtime p.r.n. and lactulose 30 mL p.o. twice a day (to be adjusted by the patient and her family accordingly) for constipation as she did not have a bowel movement postoperatively. She is to call my office in the interim for any problems.  ____________________________ Consuela Mimes, MD wfm:slb D: 04/10/2012 09:14:54 ET T: 04/11/2012 16:18:45 ET JOB#: [id number]  cc: Consuela Mimes, MD, <Dictator> Kathlene November. Grayland Ormond, MD Consuela Mimes MD ELECTRONICALLY SIGNED 04/19/2012 19:51

## 2014-11-28 NOTE — Op Note (Signed)
PATIENT NAME:  Autumn Wright, Autumn Wright MR#:  [id number] DATE OF BIRTH:  February 21, 1935  DATE OF PROCEDURE:  04/05/2012  PREOPERATIVE DIAGNOSIS: Hepatocellular carcinoma of the left lobe of the liver.   POSTOPERATIVE DIAGNOSES: 1. Hepatocellular carcinoma of the left lobe of the liver. 2. Main portal vein tumor thrombus.   OPERATION PERFORMED:  1. Exploratory laparotomy.  2. Portal vein lymph node excision.  3. Intraoperative ultrasound of liver.  4. Left subclavian vein central venous catheter placement.   SURGEON: Consuela Mimes, MD   FIRST ASSISTANT: Dr. Marina Gravel   ANESTHESIA: General.   PROCEDURE IN DETAIL: The patient was placed supine on the operating room table and prepped and draped in the usual sterile fashion and a left subclavian vein central venous catheter was placed with the double Seldinger technique and the patient in Trendelenburg. It was secured to the skin with 3-0 silk sutures and dressed sterilely and each of three lumens withdrew blood easily and was flushed with heparinized saline easily. The patient was then reprepped and redraped and an extended right subcostal incision was made with an extension in the midline up to the right of the xiphoid process. The peritoneal cavity was entered. There was no carcinomatosis. There appeared to be something very firm within the porta hepatis. The round ligament was divided with the Harmonic scalpel and the falciform ligament was divided with electrocautery. The gastrohepatic ligament was divided with the electrocautery and the Harmonic scalpel and the left triangular ligament was divided but the right triangular ligament was left intact. Dissection just to the left of the porta hepatis revealed what I thought initially was a very firm lymph node. I excised this with a combination of the electrocautery and the Harmonic scalpel and in doing so I got into the left gastric vein which I closed with multiple figure-of-eight 5-0 Prolene sutures. After  the lymph node was sent to pathology, it was apparent that the left portal vein itself was very firm. An intraoperative ultrasound confirmed that the left main portal vein was 95% filled with tumor thrombus and that there was a very small rim of vein with hepatopetal flow within it. Thus, the patient was deemed unresectable. Additional intraoperative ultrasound images were obtained of the tumor in the left medial segment and the caudate lobe of the liver and ultimately the frozen section on the portal vein lymph node returned benign. Additional hemostasis was achieved in the area of the left gastric vein with Avitene and Surgicel and the abdominal wall was closed with two layers of running #1 PDS suture. The skin was reapproximated with a skin stapling device. A sterile dressing was applied completing the procedure. The patient tolerated the procedure well. There were no complications.   ____________________________ Consuela Mimes, MD wfm:drc D: 04/05/2012 10:04:44 ET T: 04/05/2012 12:03:07 ET JOB#: [id number]  cc: Consuela Mimes, MD, <Dictator> Consuela Mimes MD ELECTRONICALLY SIGNED 04/11/2012 11:18
